# Patient Record
Sex: Female | Born: 1940 | Race: White | Hispanic: No | Marital: Married | Smoking: Never smoker
Health system: Southern US, Community
[De-identification: ages and names within clinical notes are randomized; demographics above are authoritative.]

## PROBLEM LIST (undated history)

## (undated) DIAGNOSIS — E162 Hypoglycemia, unspecified: Secondary | ICD-10-CM

## (undated) DIAGNOSIS — M8580 Other specified disorders of bone density and structure, unspecified site: Secondary | ICD-10-CM

## (undated) DIAGNOSIS — G629 Polyneuropathy, unspecified: Secondary | ICD-10-CM

## (undated) DIAGNOSIS — M797 Fibromyalgia: Secondary | ICD-10-CM

## (undated) DIAGNOSIS — Z8589 Personal history of malignant neoplasm of other organs and systems: Secondary | ICD-10-CM

## (undated) HISTORY — PX: RECTAL PROLAPSE REPAIR: SHX759

## (undated) HISTORY — DX: Other specified disorders of bone density and structure, unspecified site: M85.80

## (undated) HISTORY — DX: Polyneuropathy, unspecified: G62.9

## (undated) HISTORY — PX: DILATION AND CURETTAGE OF UTERUS: SHX78

## (undated) HISTORY — PX: CHOLECYSTECTOMY: SHX55

## (undated) HISTORY — DX: Hypoglycemia, unspecified: E16.2

## (undated) HISTORY — DX: Fibromyalgia: M79.7

## (undated) HISTORY — DX: Personal history of malignant neoplasm of other organs and systems: Z85.89

---

## 1999-06-25 ENCOUNTER — Encounter (INDEPENDENT_AMBULATORY_CARE_PROVIDER_SITE_OTHER): Payer: Self-pay | Admitting: Specialist

## 1999-06-25 ENCOUNTER — Other Ambulatory Visit: Admission: RE | Admit: 1999-06-25 | Discharge: 1999-06-25 | Payer: Self-pay | Admitting: Obstetrics and Gynecology

## 2001-09-21 ENCOUNTER — Other Ambulatory Visit: Admission: RE | Admit: 2001-09-21 | Discharge: 2001-09-21 | Payer: Self-pay | Admitting: Obstetrics and Gynecology

## 2002-03-17 ENCOUNTER — Encounter: Admission: RE | Admit: 2002-03-17 | Discharge: 2002-03-17 | Payer: Self-pay | Admitting: Internal Medicine

## 2002-03-17 ENCOUNTER — Encounter: Payer: Self-pay | Admitting: Internal Medicine

## 2002-03-30 ENCOUNTER — Encounter: Payer: Self-pay | Admitting: General Surgery

## 2002-04-04 ENCOUNTER — Observation Stay (HOSPITAL_COMMUNITY): Admission: RE | Admit: 2002-04-04 | Discharge: 2002-04-05 | Payer: Self-pay | Admitting: General Surgery

## 2002-04-04 ENCOUNTER — Encounter (INDEPENDENT_AMBULATORY_CARE_PROVIDER_SITE_OTHER): Payer: Self-pay

## 2002-04-04 ENCOUNTER — Encounter: Payer: Self-pay | Admitting: General Surgery

## 2002-04-14 ENCOUNTER — Ambulatory Visit (HOSPITAL_COMMUNITY): Admission: RE | Admit: 2002-04-14 | Discharge: 2002-04-14 | Payer: Self-pay | Admitting: General Surgery

## 2002-04-14 ENCOUNTER — Encounter: Payer: Self-pay | Admitting: General Surgery

## 2003-03-02 ENCOUNTER — Other Ambulatory Visit: Admission: RE | Admit: 2003-03-02 | Discharge: 2003-03-02 | Payer: Self-pay | Admitting: Obstetrics and Gynecology

## 2003-05-15 ENCOUNTER — Encounter: Payer: Self-pay | Admitting: Geriatric Medicine

## 2003-05-15 ENCOUNTER — Encounter: Admission: RE | Admit: 2003-05-15 | Discharge: 2003-05-15 | Payer: Self-pay | Admitting: Geriatric Medicine

## 2005-04-15 ENCOUNTER — Other Ambulatory Visit: Admission: RE | Admit: 2005-04-15 | Discharge: 2005-04-15 | Payer: Self-pay | Admitting: Obstetrics and Gynecology

## 2007-08-20 ENCOUNTER — Encounter (INDEPENDENT_AMBULATORY_CARE_PROVIDER_SITE_OTHER): Payer: Self-pay | Admitting: General Surgery

## 2007-08-20 ENCOUNTER — Ambulatory Visit (HOSPITAL_COMMUNITY): Admission: RE | Admit: 2007-08-20 | Discharge: 2007-08-20 | Payer: Self-pay | Admitting: General Surgery

## 2010-12-03 NOTE — Op Note (Signed)
[name], OAK                ACCOUNT NO.:  [id number]   MEDICAL RECORD NO.:  [id number]          PATIENT TYPE:  AMB   LOCATION:  DAY                          FACILITY:  [facility]   PHYSICIAN:  Timothy E. Earlene Plater, M.D. DATE OF BIRTH:  08/28/1940   DATE OF PROCEDURE:  DATE OF DISCHARGE:                               OPERATIVE REPORT   PREOPERATIVE DIAGNOSES:  Prolapsing hemorrhoids.   POSTOPERATIVE DIAGNOSES:  Prolapsing hemorrhoids.   PROCEDURE:  Hemorrhoidectomy by PPH.   SURGEON:  Dr. Kendrick Ranch.   ANESTHESIA:  General.   [name] is 82, otherwise perfectly healthy, normal colonoscopy,  normal bowel movements but over the years has developed prolapsing  hemorrhoids with soilage,  difficulty cleansing and bleeding.  Because  of this and failure of the usual management, she elects to proceed with  surgery which has been carefully discussed.  The patient was seen,  identified and the permit signed.   She was taken to the operating room, placed supine, general LMA  anesthesia provided.  She was placed in lithotomy, perianal area  inspected, prepped and draped in the usual fashion.  Marcaine 1/2% with  epinephrine mixed 1:30 with Wydase was injected round about the anal  orifice for a wide field block.  The anus was massaged, gently dilated  and the operating anoscope introduced. A very large bleeding hemorrhoid  was present in the  right anterior position, otherwise there was rectal  mucosal prolapse.  No actual external hemorrhoids were present.  A  pursestring suture of Prolene 2-0 was placed around about the rectal  mucosa and approximately 3 cm proximal to the dentate line. This was  checked, found to be correct and then the operating anoscope placed, the  suture lines brought through the middle of  that scope. The stapling  device opened completely, gently inserted into the operating anoscope  and popped through the pursestring suture which was then tied about the  shaft  of the stapling device. The stapling device was carefully aligned  and with gentle pressure on the suture it was closed. The position was  good. It was held for 60 seconds, fired and then held for 60 seconds and  then removed.  The specimen was a complete round of tissue with suture  intact. Further inspection of the rectal canal revealed two tiny areas  of bleeding in right posterior and left posterior. Each of these was  sutured with a 4-0 Vicryl stitch.  After careful observation, there was  no further bleeding or evidence of trouble. A Gelfoam gauze wrapped in  Surgicel was placed in the rectal canal and dry sterile dressing.  All  counts correct.  She tolerated it well and was taken to the recovery  room in good condition.   Written and verbal instructions given to her and her family.  She has  pain medication. She will alternate that with ibuprofen, call for any  trouble and return in 3 weeks.      Timothy E. Earlene Plater, M.D.  Electronically Signed     TED/MEDQ  D:  08/20/2007  T:  08/20/2007  Job:  [id number]   cc:   Hal T. Stoneking, M.D.  Fax: [phone]

## 2010-12-06 NOTE — Op Note (Signed)
[name], [name]                          ACCOUNT NO.:  [id number]   MEDICAL RECORD NO.:  [id number]                   PATIENT TYPE:  AMB   LOCATION:  DAY                                  FACILITY:  [facility]   PHYSICIAN:  Gita Kudo, M.D.              DATE OF BIRTH:  01-Sep-1940   DATE OF PROCEDURE:  04/04/2002  DATE OF DISCHARGE:                                 OPERATIVE REPORT   PREOPERATIVE DIAGNOSES:  Cholecystitis.   POSTOPERATIVE DIAGNOSES:  Cholecystitis.   OPERATION PERFORMED:  Laparoscopic cholecystectomy with intraoperative  cholangiogram.   SURGEON:  Gita Kudo, M.D.   ASSISTANT:  Donnie Coffin. Samuella Cota, M.D.   ANESTHESIA:  General endotracheal.   INDICATIONS FOR PROCEDURE:  The patient is a 70 year old female with bouts  of abdominal pain and gallbladder ultrasound showing stones.  Liver function  studies normal.   OPERATIVE FINDINGS:  The gallbladder had a few filmy adhesions.  The cystic  duct was not enlarged.  There were multiple stones in the gallbladder.  The  operative cholangiogram looked normal.  The cystic duct and artery were all  identified before transecting and clipping.   DESCRIPTION OF PROCEDURE:  Under satisfactory general endotracheal  anesthesia, having received 400 mg of Cipro IV, the patient's abdomen was  prepped and draped in standard fashion.  A transverse incision was made  above the umbilicus and the midline opened into the peritoneum.  Controlled  with a figure of eight 0 Vicryl suture.  Operating Hasson port inserted and  good CO2 pneumoperitoneum established.  Camera placed and through Marcaine  infiltrated skin incisions, two #5 ports placed laterally and a second #10  medially.  With graspers through the lateral port giving excellent exposure,  I operated through the medial port and carefully took down the adhesions to  the gallbladder.  The coagulating dissector was used.  Then a right angle  clamp was used to  circumferentially dissect the cystic duct and artery.  When certain of the anatomy, a clip was placed on the cystic duct close to  the gallbladder and multiple clips on the cystic artery.  The artery was  divided and an incision made in the cystic duct and the percutaneously  placed catheter used to get good cholangiograms.  After the x-rays were  taken, the catheter removed and the cystic duct controlled with multiple  clips and divided.  The gallbladder removed from below upwards using the  coagulating hook dissector for hemostasis and dissection.  The liver bed was  made dry by cautery.  Lavaged with saline and suctioned dry.   Camera moved to the upper port and through the lower port, a large grasper  placed and used to extract the gallbladder.  Because of the multiple stones,  the gallbladder was opened externally and forceps used to remove the stones  until it could be removed through the midline incision which  was slightly  enlarged.  Following this, the operative site was checked for hemostasis,  lavaged with saline and suctioned dry.  Ports were removed under direct  vision and the CO2 released.  The midline was then closed with a previous  figure-of-eight and a second interrupted  0 Vicryl suture.  The wounds were lavaged with saline and closed with 4-0  Vicryl for the subcutaneous tissues and Steri-Strips for the skin.  Sterile  absorbent dressings applied.  The patient was then transferred to the  recovery room from the operating room in good condition.                                                 Gita Kudo, M.D.    MRL/MEDQ  D:  04/04/2002  T:  04/04/2002  Job:  [id number]   cc:   Rachael T. Stoneking, MD  [address] [place], [place] [address]  Fax: [phone]

## 2011-04-10 LAB — BASIC METABOLIC PANEL
BUN: 10
CO2: 30
Calcium: 9.9
Chloride: 107
Creatinine, Ser: 0.75
GFR calc Af Amer: >60
GFR calc non Af Amer: >60
Glucose, Bld: 113 — ABNORMAL HIGH
Potassium: 4.3
Sodium: 144

## 2015-10-17 DIAGNOSIS — E7800 Pure hypercholesterolemia, unspecified: Secondary | ICD-10-CM | POA: Diagnosis not present

## 2015-10-17 DIAGNOSIS — Z0000 Encounter for general adult medical examination without abnormal findings: Secondary | ICD-10-CM | POA: Diagnosis not present

## 2015-10-17 DIAGNOSIS — R251 Tremor, unspecified: Secondary | ICD-10-CM | POA: Diagnosis not present

## 2015-10-17 DIAGNOSIS — Z79899 Other long term (current) drug therapy: Secondary | ICD-10-CM | POA: Diagnosis not present

## 2015-10-17 DIAGNOSIS — R002 Palpitations: Secondary | ICD-10-CM | POA: Diagnosis not present

## 2015-10-17 DIAGNOSIS — Z1389 Encounter for screening for other disorder: Secondary | ICD-10-CM | POA: Diagnosis not present

## 2015-12-04 DIAGNOSIS — H04123 Dry eye syndrome of bilateral lacrimal glands: Secondary | ICD-10-CM | POA: Diagnosis not present

## 2015-12-04 DIAGNOSIS — H10413 Chronic giant papillary conjunctivitis, bilateral: Secondary | ICD-10-CM | POA: Diagnosis not present

## 2015-12-04 DIAGNOSIS — H524 Presbyopia: Secondary | ICD-10-CM | POA: Diagnosis not present

## 2015-12-04 DIAGNOSIS — H26491 Other secondary cataract, right eye: Secondary | ICD-10-CM | POA: Diagnosis not present

## 2016-04-23 DIAGNOSIS — Z1231 Encounter for screening mammogram for malignant neoplasm of breast: Secondary | ICD-10-CM | POA: Diagnosis not present

## 2016-04-23 DIAGNOSIS — Z803 Family history of malignant neoplasm of breast: Secondary | ICD-10-CM | POA: Diagnosis not present

## 2016-05-26 ENCOUNTER — Other Ambulatory Visit: Payer: Self-pay | Admitting: Nurse Practitioner

## 2016-05-26 ENCOUNTER — Ambulatory Visit
Admission: RE | Admit: 2016-05-26 | Discharge: 2016-05-26 | Disposition: A | Discharge status: Home / Self Care | Payer: PPO | Source: Ambulatory Visit | Attending: Nurse Practitioner | Admitting: Nurse Practitioner

## 2016-05-26 DIAGNOSIS — M47816 Spondylosis without myelopathy or radiculopathy, lumbar region: Secondary | ICD-10-CM | POA: Diagnosis not present

## 2016-05-26 DIAGNOSIS — G629 Polyneuropathy, unspecified: Secondary | ICD-10-CM | POA: Diagnosis not present

## 2016-07-15 DIAGNOSIS — R202 Paresthesia of skin: Secondary | ICD-10-CM | POA: Diagnosis not present

## 2016-08-21 DIAGNOSIS — G629 Polyneuropathy, unspecified: Secondary | ICD-10-CM | POA: Diagnosis not present

## 2016-10-20 DIAGNOSIS — Z79899 Other long term (current) drug therapy: Secondary | ICD-10-CM | POA: Diagnosis not present

## 2016-10-20 DIAGNOSIS — Z0000 Encounter for general adult medical examination without abnormal findings: Secondary | ICD-10-CM | POA: Diagnosis not present

## 2016-10-20 DIAGNOSIS — G629 Polyneuropathy, unspecified: Secondary | ICD-10-CM | POA: Diagnosis not present

## 2016-10-20 DIAGNOSIS — M797 Fibromyalgia: Secondary | ICD-10-CM | POA: Diagnosis not present

## 2016-10-20 DIAGNOSIS — Z1389 Encounter for screening for other disorder: Secondary | ICD-10-CM | POA: Diagnosis not present

## 2016-10-20 DIAGNOSIS — E7800 Pure hypercholesterolemia, unspecified: Secondary | ICD-10-CM | POA: Diagnosis not present

## 2016-12-09 DIAGNOSIS — H524 Presbyopia: Secondary | ICD-10-CM | POA: Diagnosis not present

## 2017-05-14 DIAGNOSIS — H04123 Dry eye syndrome of bilateral lacrimal glands: Secondary | ICD-10-CM | POA: Diagnosis not present

## 2017-05-14 DIAGNOSIS — H1131 Conjunctival hemorrhage, right eye: Secondary | ICD-10-CM | POA: Diagnosis not present

## 2017-07-30 DIAGNOSIS — Z803 Family history of malignant neoplasm of breast: Secondary | ICD-10-CM | POA: Diagnosis not present

## 2017-07-30 DIAGNOSIS — Z1231 Encounter for screening mammogram for malignant neoplasm of breast: Secondary | ICD-10-CM | POA: Diagnosis not present

## 2017-08-19 DIAGNOSIS — I781 Nevus, non-neoplastic: Secondary | ICD-10-CM | POA: Diagnosis not present

## 2017-08-19 DIAGNOSIS — Z23 Encounter for immunization: Secondary | ICD-10-CM | POA: Diagnosis not present

## 2017-10-27 DIAGNOSIS — E7800 Pure hypercholesterolemia, unspecified: Secondary | ICD-10-CM | POA: Diagnosis not present

## 2017-10-27 DIAGNOSIS — Z1389 Encounter for screening for other disorder: Secondary | ICD-10-CM | POA: Diagnosis not present

## 2017-10-27 DIAGNOSIS — Z780 Asymptomatic menopausal state: Secondary | ICD-10-CM | POA: Diagnosis not present

## 2017-10-27 DIAGNOSIS — Z23 Encounter for immunization: Secondary | ICD-10-CM | POA: Diagnosis not present

## 2017-10-27 DIAGNOSIS — Z0000 Encounter for general adult medical examination without abnormal findings: Secondary | ICD-10-CM | POA: Diagnosis not present

## 2017-10-27 DIAGNOSIS — G629 Polyneuropathy, unspecified: Secondary | ICD-10-CM | POA: Diagnosis not present

## 2017-10-27 DIAGNOSIS — Z79899 Other long term (current) drug therapy: Secondary | ICD-10-CM | POA: Diagnosis not present

## 2017-12-10 DIAGNOSIS — Z780 Asymptomatic menopausal state: Secondary | ICD-10-CM | POA: Diagnosis not present

## 2017-12-10 DIAGNOSIS — M8589 Other specified disorders of bone density and structure, multiple sites: Secondary | ICD-10-CM | POA: Diagnosis not present

## 2017-12-15 DIAGNOSIS — H524 Presbyopia: Secondary | ICD-10-CM | POA: Diagnosis not present

## 2017-12-23 ENCOUNTER — Other Ambulatory Visit: Payer: Self-pay | Admitting: Geriatric Medicine

## 2017-12-23 DIAGNOSIS — R221 Localized swelling, mass and lump, neck: Secondary | ICD-10-CM

## 2017-12-23 DIAGNOSIS — G629 Polyneuropathy, unspecified: Secondary | ICD-10-CM | POA: Diagnosis not present

## 2017-12-23 DIAGNOSIS — E7800 Pure hypercholesterolemia, unspecified: Secondary | ICD-10-CM | POA: Diagnosis not present

## 2017-12-23 DIAGNOSIS — M810 Age-related osteoporosis without current pathological fracture: Secondary | ICD-10-CM | POA: Diagnosis not present

## 2017-12-29 ENCOUNTER — Ambulatory Visit
Admission: RE | Admit: 2017-12-29 | Discharge: 2017-12-29 | Disposition: A | Discharge status: Home / Self Care | Payer: PPO | Source: Ambulatory Visit | Attending: Geriatric Medicine | Admitting: Geriatric Medicine

## 2017-12-29 DIAGNOSIS — E041 Nontoxic single thyroid nodule: Secondary | ICD-10-CM | POA: Diagnosis not present

## 2017-12-29 DIAGNOSIS — R221 Localized swelling, mass and lump, neck: Secondary | ICD-10-CM

## 2018-11-05 DIAGNOSIS — M8588 Other specified disorders of bone density and structure, other site: Secondary | ICD-10-CM | POA: Diagnosis not present

## 2018-11-05 DIAGNOSIS — Z0000 Encounter for general adult medical examination without abnormal findings: Secondary | ICD-10-CM | POA: Diagnosis not present

## 2018-11-05 DIAGNOSIS — G629 Polyneuropathy, unspecified: Secondary | ICD-10-CM | POA: Diagnosis not present

## 2018-11-05 DIAGNOSIS — L989 Disorder of the skin and subcutaneous tissue, unspecified: Secondary | ICD-10-CM | POA: Diagnosis not present

## 2018-11-05 DIAGNOSIS — Z1389 Encounter for screening for other disorder: Secondary | ICD-10-CM | POA: Diagnosis not present

## 2018-11-05 DIAGNOSIS — E7800 Pure hypercholesterolemia, unspecified: Secondary | ICD-10-CM | POA: Diagnosis not present

## 2018-11-05 DIAGNOSIS — Z79899 Other long term (current) drug therapy: Secondary | ICD-10-CM | POA: Diagnosis not present

## 2018-11-05 DIAGNOSIS — M25562 Pain in left knee: Secondary | ICD-10-CM | POA: Diagnosis not present

## 2018-12-20 DIAGNOSIS — L570 Actinic keratosis: Secondary | ICD-10-CM | POA: Diagnosis not present

## 2018-12-20 DIAGNOSIS — L430 Hypertrophic lichen planus: Secondary | ICD-10-CM | POA: Diagnosis not present

## 2018-12-20 DIAGNOSIS — L814 Other melanin hyperpigmentation: Secondary | ICD-10-CM | POA: Diagnosis not present

## 2018-12-22 DIAGNOSIS — H524 Presbyopia: Secondary | ICD-10-CM | POA: Diagnosis not present

## 2018-12-22 DIAGNOSIS — H04123 Dry eye syndrome of bilateral lacrimal glands: Secondary | ICD-10-CM | POA: Diagnosis not present

## 2019-01-05 DIAGNOSIS — E7800 Pure hypercholesterolemia, unspecified: Secondary | ICD-10-CM | POA: Diagnosis not present

## 2019-01-05 DIAGNOSIS — M8588 Other specified disorders of bone density and structure, other site: Secondary | ICD-10-CM | POA: Diagnosis not present

## 2019-01-05 DIAGNOSIS — Z79899 Other long term (current) drug therapy: Secondary | ICD-10-CM | POA: Diagnosis not present

## 2019-01-05 DIAGNOSIS — R7309 Other abnormal glucose: Secondary | ICD-10-CM | POA: Diagnosis not present

## 2019-04-21 DIAGNOSIS — Z803 Family history of malignant neoplasm of breast: Secondary | ICD-10-CM | POA: Diagnosis not present

## 2019-04-21 DIAGNOSIS — Z1231 Encounter for screening mammogram for malignant neoplasm of breast: Secondary | ICD-10-CM | POA: Diagnosis not present

## 2019-08-28 ENCOUNTER — Ambulatory Visit: Payer: Medicare HMO

## 2019-09-02 IMAGING — US US THYROID
1 series · 14 of 25 positions shown · non-contrast
Comparison: None.

CLINICAL DATA: Neck fullness.

EXAM:
THYROID ULTRASOUND
TECHNIQUE: Ultrasound examination of the thyroid gland and adjacent soft
tissues was performed.

[Series 1: us thyroid · 0.05mm/px · 14 of 53 slices shown]
[im 1/53]
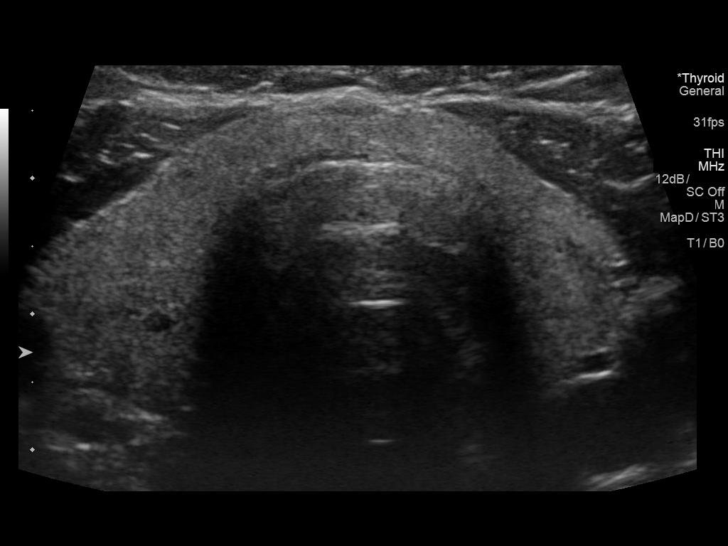
[im 5/53]
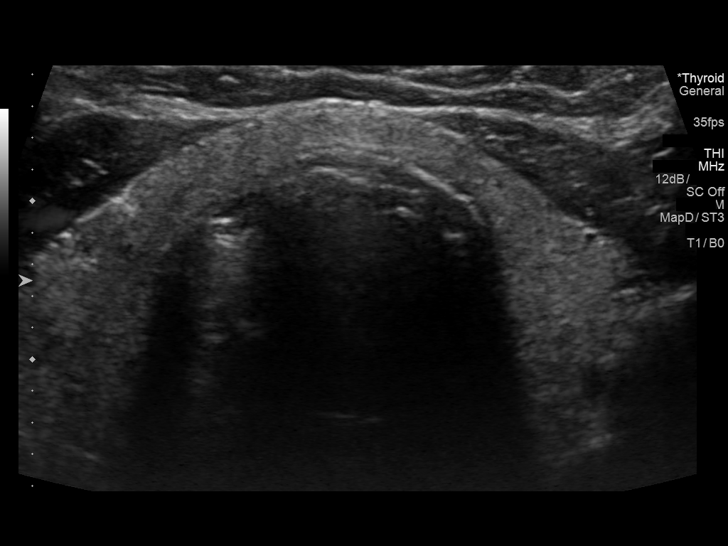
[im 9/53]
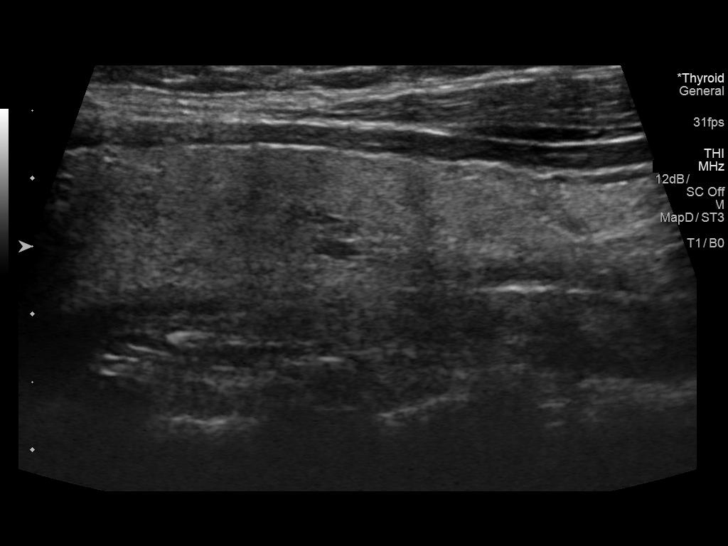
[im 14/53]
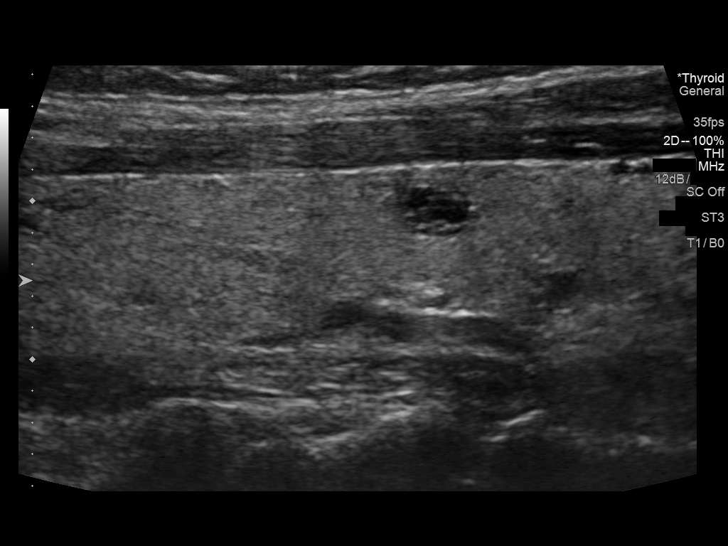
[im 18/53]
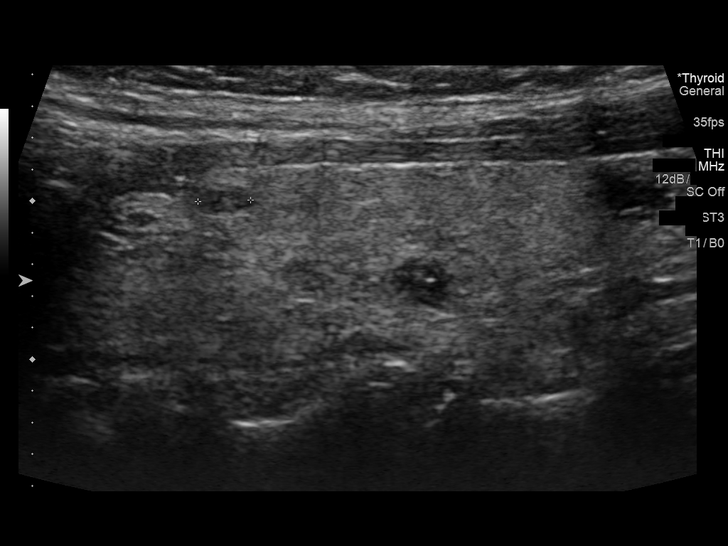
[im 20/53]
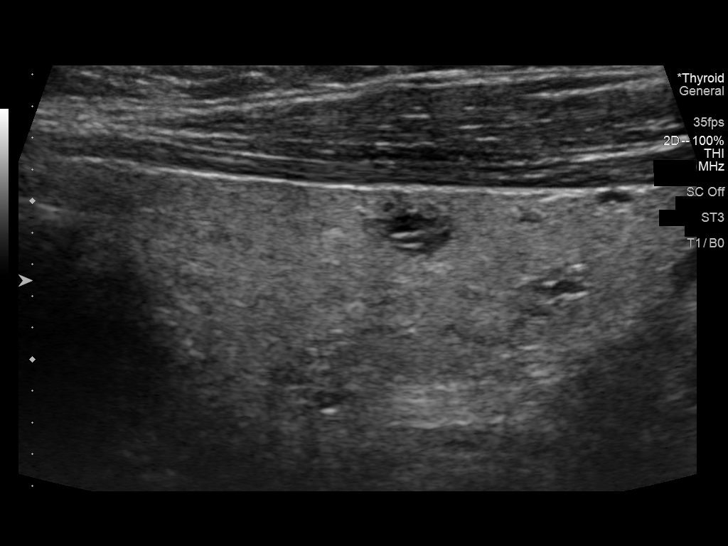
[im 24/53]
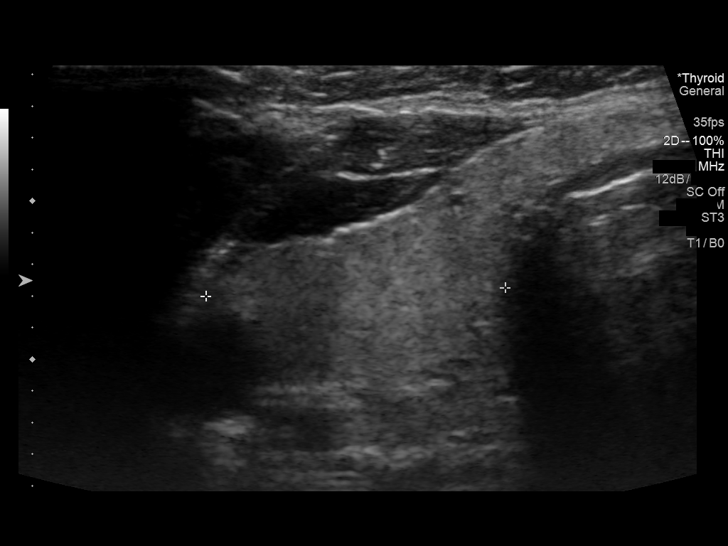
[im 29/53]
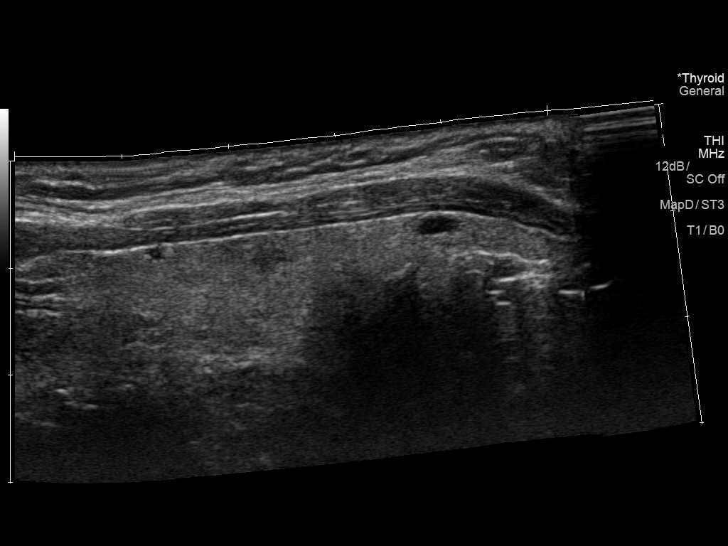
[im 33/53]
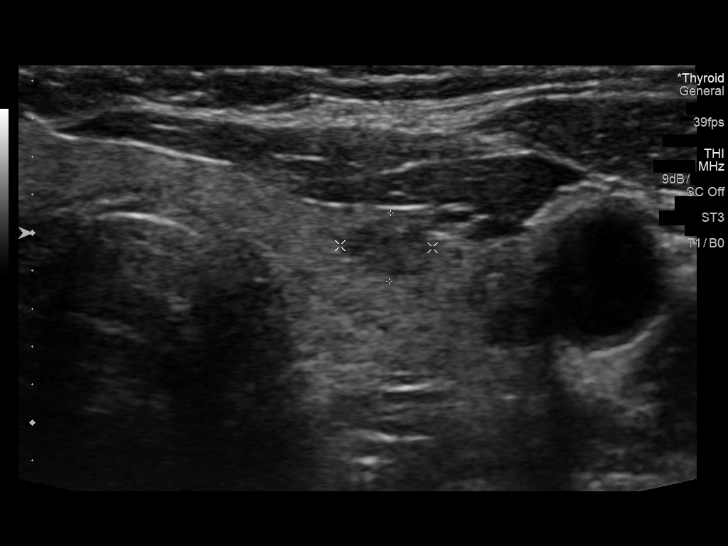
[im 35/53]
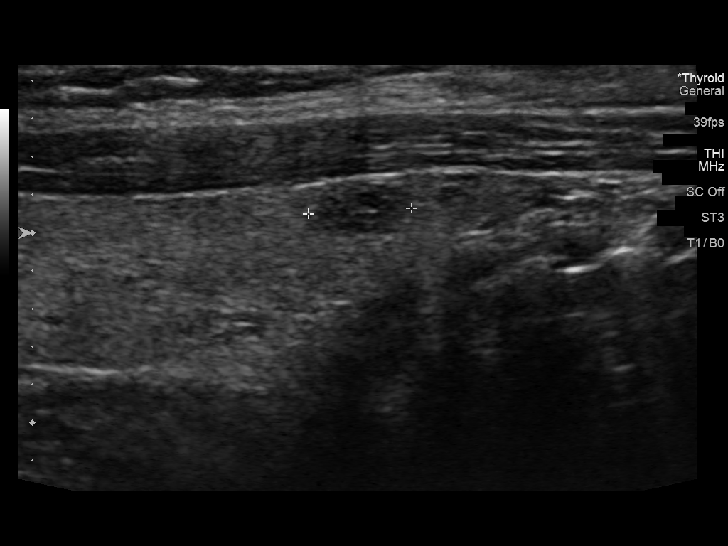
[im 40/53]
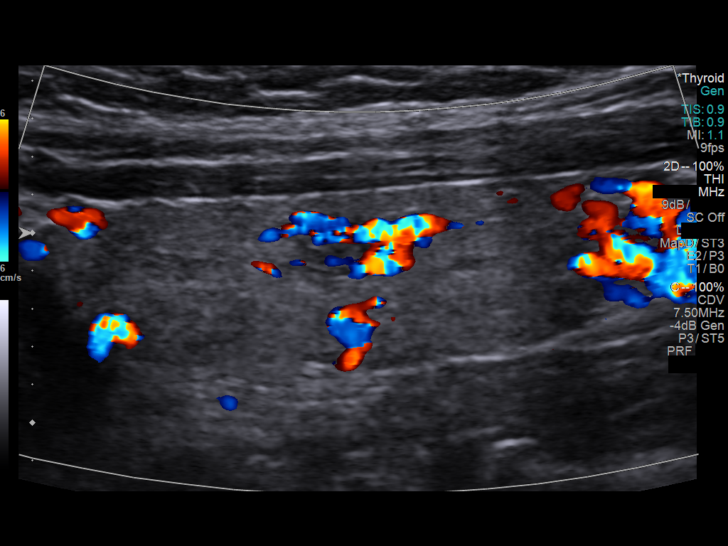
[im 44/53]
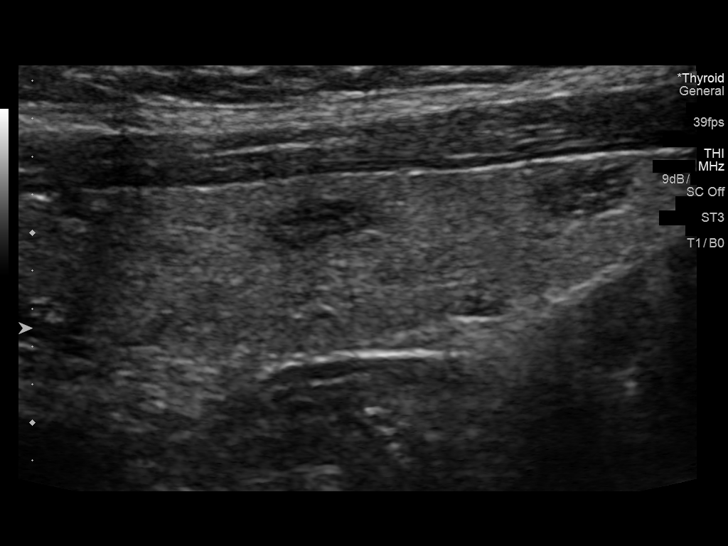
[im 48/53]
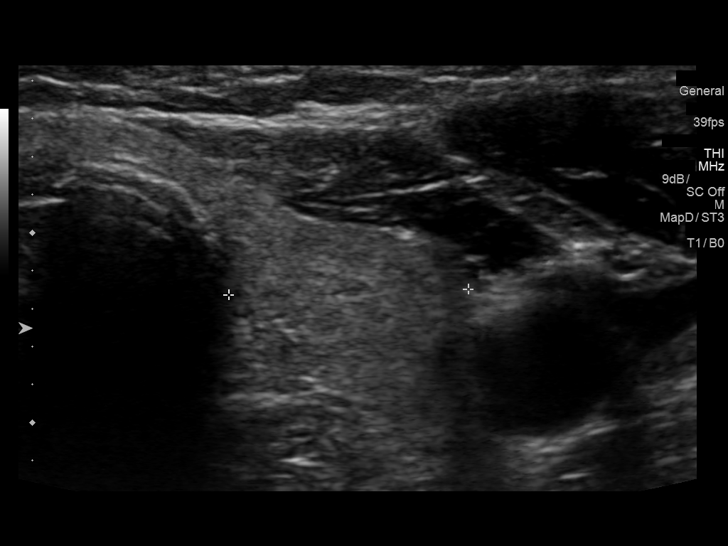
[im 53/53]
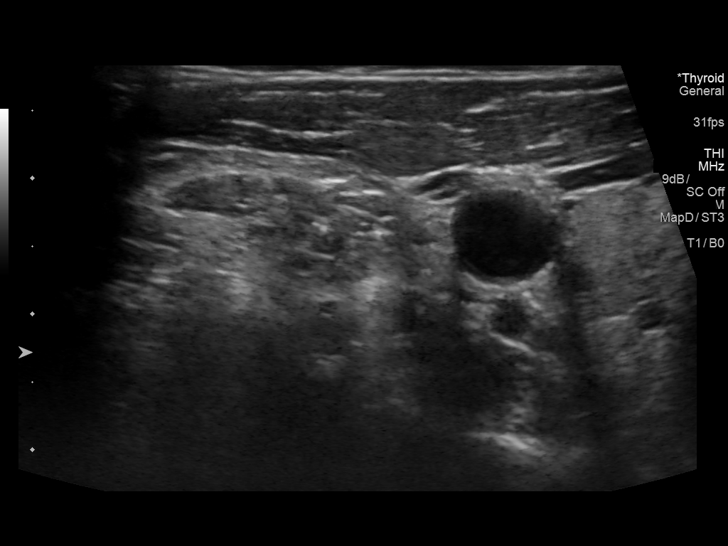

[14 of 25 positions shown; findings below may reference images not displayed]

FINDINGS: Parenchymal Echotexture: Mildly heterogenous

Isthmus:

Right lobe: 5.3 x 1.3 x 1.9 cm

Left lobe: 5.1 x 1.2 x 1.3 cm

_________________________________________________________

Estimated total number of nodules >/= 1 cm: 0

Number of spongiform nodules >/=  2 cm not described below (TR1): 0

Number of mixed cystic and solid nodules >/= 1.5 cm not described
below (TR2): 0

_________________________________________________________

Multiple small nodules throughout the thyroid. The largest nodule
measures 0.7 cm in the left thyroid lobe. These nodules do not meet
criteria for biopsy or dedicated follow-up.
IMPRESSION: Multiple small thyroid nodules. These nodules do not meet criteria
for biopsy or dedicated follow-up.

The above is in keeping with the ACR TI-RADS recommendations - [HOSPITAL] 9147;[DATE].

## 2019-09-12 ENCOUNTER — Ambulatory Visit: Payer: PPO

## 2019-11-08 DIAGNOSIS — Z1389 Encounter for screening for other disorder: Secondary | ICD-10-CM | POA: Diagnosis not present

## 2019-11-08 DIAGNOSIS — H9012 Conductive hearing loss, unilateral, left ear, with unrestricted hearing on the contralateral side: Secondary | ICD-10-CM | POA: Diagnosis not present

## 2019-11-08 DIAGNOSIS — Z79899 Other long term (current) drug therapy: Secondary | ICD-10-CM | POA: Diagnosis not present

## 2019-11-08 DIAGNOSIS — N649 Disorder of breast, unspecified: Secondary | ICD-10-CM | POA: Diagnosis not present

## 2019-11-08 DIAGNOSIS — M797 Fibromyalgia: Secondary | ICD-10-CM | POA: Diagnosis not present

## 2019-11-08 DIAGNOSIS — G629 Polyneuropathy, unspecified: Secondary | ICD-10-CM | POA: Diagnosis not present

## 2019-11-08 DIAGNOSIS — Z0000 Encounter for general adult medical examination without abnormal findings: Secondary | ICD-10-CM | POA: Diagnosis not present

## 2019-11-08 DIAGNOSIS — E7800 Pure hypercholesterolemia, unspecified: Secondary | ICD-10-CM | POA: Diagnosis not present

## 2019-11-15 DIAGNOSIS — L821 Other seborrheic keratosis: Secondary | ICD-10-CM | POA: Diagnosis not present

## 2019-11-15 DIAGNOSIS — L820 Inflamed seborrheic keratosis: Secondary | ICD-10-CM | POA: Diagnosis not present

## 2019-11-15 DIAGNOSIS — D485 Neoplasm of uncertain behavior of skin: Secondary | ICD-10-CM | POA: Diagnosis not present

## 2019-11-16 DIAGNOSIS — H6122 Impacted cerumen, left ear: Secondary | ICD-10-CM | POA: Diagnosis not present

## 2019-12-21 DIAGNOSIS — H1131 Conjunctival hemorrhage, right eye: Secondary | ICD-10-CM | POA: Diagnosis not present

## 2019-12-28 DIAGNOSIS — H1131 Conjunctival hemorrhage, right eye: Secondary | ICD-10-CM | POA: Diagnosis not present

## 2019-12-28 DIAGNOSIS — H04123 Dry eye syndrome of bilateral lacrimal glands: Secondary | ICD-10-CM | POA: Diagnosis not present

## 2019-12-28 DIAGNOSIS — H26491 Other secondary cataract, right eye: Secondary | ICD-10-CM | POA: Diagnosis not present

## 2020-07-02 DIAGNOSIS — Z20822 Contact with and (suspected) exposure to covid-19: Secondary | ICD-10-CM | POA: Diagnosis not present

## 2020-09-14 DIAGNOSIS — Z1231 Encounter for screening mammogram for malignant neoplasm of breast: Secondary | ICD-10-CM | POA: Diagnosis not present

## 2020-11-14 DIAGNOSIS — M797 Fibromyalgia: Secondary | ICD-10-CM | POA: Diagnosis not present

## 2020-11-14 DIAGNOSIS — Z0000 Encounter for general adult medical examination without abnormal findings: Secondary | ICD-10-CM | POA: Diagnosis not present

## 2020-11-14 DIAGNOSIS — Z1389 Encounter for screening for other disorder: Secondary | ICD-10-CM | POA: Diagnosis not present

## 2020-11-14 DIAGNOSIS — Z79899 Other long term (current) drug therapy: Secondary | ICD-10-CM | POA: Diagnosis not present

## 2020-11-14 DIAGNOSIS — E7800 Pure hypercholesterolemia, unspecified: Secondary | ICD-10-CM | POA: Diagnosis not present

## 2020-11-14 DIAGNOSIS — G629 Polyneuropathy, unspecified: Secondary | ICD-10-CM | POA: Diagnosis not present

## 2020-12-14 DIAGNOSIS — R300 Dysuria: Secondary | ICD-10-CM | POA: Diagnosis not present

## 2020-12-31 DIAGNOSIS — H52222 Regular astigmatism, left eye: Secondary | ICD-10-CM | POA: Diagnosis not present

## 2020-12-31 DIAGNOSIS — H52221 Regular astigmatism, right eye: Secondary | ICD-10-CM | POA: Diagnosis not present

## 2020-12-31 DIAGNOSIS — H524 Presbyopia: Secondary | ICD-10-CM | POA: Diagnosis not present

## 2020-12-31 DIAGNOSIS — H5202 Hypermetropia, left eye: Secondary | ICD-10-CM | POA: Diagnosis not present

## 2021-05-14 DIAGNOSIS — Z008 Encounter for other general examination: Secondary | ICD-10-CM | POA: Diagnosis not present

## 2021-05-14 DIAGNOSIS — R030 Elevated blood-pressure reading, without diagnosis of hypertension: Secondary | ICD-10-CM | POA: Diagnosis not present

## 2021-05-14 DIAGNOSIS — G8929 Other chronic pain: Secondary | ICD-10-CM | POA: Diagnosis not present

## 2021-05-14 DIAGNOSIS — G629 Polyneuropathy, unspecified: Secondary | ICD-10-CM | POA: Diagnosis not present

## 2021-05-14 DIAGNOSIS — Z880 Allergy status to penicillin: Secondary | ICD-10-CM | POA: Diagnosis not present

## 2021-05-14 DIAGNOSIS — Z87892 Personal history of anaphylaxis: Secondary | ICD-10-CM | POA: Diagnosis not present

## 2021-06-17 DIAGNOSIS — M1711 Unilateral primary osteoarthritis, right knee: Secondary | ICD-10-CM | POA: Diagnosis not present

## 2022-02-05 DIAGNOSIS — M797 Fibromyalgia: Secondary | ICD-10-CM | POA: Diagnosis not present

## 2022-02-05 DIAGNOSIS — E7800 Pure hypercholesterolemia, unspecified: Secondary | ICD-10-CM | POA: Diagnosis not present

## 2022-02-05 DIAGNOSIS — Z79899 Other long term (current) drug therapy: Secondary | ICD-10-CM | POA: Diagnosis not present

## 2022-02-05 DIAGNOSIS — K623 Rectal prolapse: Secondary | ICD-10-CM | POA: Diagnosis not present

## 2022-02-05 DIAGNOSIS — Z0000 Encounter for general adult medical examination without abnormal findings: Secondary | ICD-10-CM | POA: Diagnosis not present

## 2022-02-05 DIAGNOSIS — G629 Polyneuropathy, unspecified: Secondary | ICD-10-CM | POA: Diagnosis not present

## 2022-02-05 LAB — COMPREHENSIVE METABOLIC PANEL
Albumin: 4.4 (ref 3.5–5.0)
Calcium: 9.6 (ref 8.7–10.7)
eGFR: 89

## 2022-02-05 LAB — BASIC METABOLIC PANEL
BUN: 20 (ref 4–21)
CO2: 28 — AB (ref 13–22)
Chloride: 107 (ref 99–108)
Creatinine: 0.6 (ref 0.5–1.1)
Glucose: 100
Potassium: 4.3 mEq/L (ref 3.5–5.1)
Sodium: 141 (ref 137–147)

## 2022-02-05 LAB — CBC: RBC: 4.4 (ref 3.87–5.11)

## 2022-02-05 LAB — HEPATIC FUNCTION PANEL
ALT: 25 U/L (ref 7–35)
AST: 22 (ref 13–35)

## 2022-02-05 LAB — CBC AND DIFFERENTIAL
HCT: 40 (ref 36–46)
Hemoglobin: 13.3 (ref 12.0–16.0)
Platelets: 287 10*3/uL (ref 150–400)
WBC: 4.4

## 2022-03-03 DIAGNOSIS — K641 Second degree hemorrhoids: Secondary | ICD-10-CM | POA: Diagnosis not present

## 2022-05-12 ENCOUNTER — Other Ambulatory Visit (HOSPITAL_BASED_OUTPATIENT_CLINIC_OR_DEPARTMENT_OTHER): Payer: Self-pay | Admitting: Geriatric Medicine

## 2022-05-12 DIAGNOSIS — Z1231 Encounter for screening mammogram for malignant neoplasm of breast: Secondary | ICD-10-CM

## 2022-05-21 ENCOUNTER — Encounter (HOSPITAL_BASED_OUTPATIENT_CLINIC_OR_DEPARTMENT_OTHER): Payer: Self-pay

## 2022-05-21 ENCOUNTER — Ambulatory Visit (HOSPITAL_BASED_OUTPATIENT_CLINIC_OR_DEPARTMENT_OTHER)
Admission: RE | Admit: 2022-05-21 | Discharge: 2022-05-21 | Disposition: A | Discharge status: Home / Self Care | Payer: Medicare HMO | Source: Ambulatory Visit | Attending: Geriatric Medicine | Admitting: Geriatric Medicine

## 2022-05-21 DIAGNOSIS — Z1231 Encounter for screening mammogram for malignant neoplasm of breast: Secondary | ICD-10-CM | POA: Insufficient documentation

## 2022-06-27 DIAGNOSIS — H524 Presbyopia: Secondary | ICD-10-CM | POA: Diagnosis not present

## 2022-06-27 DIAGNOSIS — H52223 Regular astigmatism, bilateral: Secondary | ICD-10-CM | POA: Diagnosis not present

## 2022-09-09 DIAGNOSIS — J019 Acute sinusitis, unspecified: Secondary | ICD-10-CM | POA: Diagnosis not present

## 2022-09-09 DIAGNOSIS — R0981 Nasal congestion: Secondary | ICD-10-CM | POA: Diagnosis not present

## 2022-09-09 DIAGNOSIS — Z20822 Contact with and (suspected) exposure to covid-19: Secondary | ICD-10-CM | POA: Diagnosis not present

## 2022-09-09 DIAGNOSIS — R519 Headache, unspecified: Secondary | ICD-10-CM | POA: Diagnosis not present

## 2022-11-19 ENCOUNTER — Ambulatory Visit (INDEPENDENT_AMBULATORY_CARE_PROVIDER_SITE_OTHER): Payer: Medicare HMO | Admitting: Nurse Practitioner

## 2022-11-19 ENCOUNTER — Encounter: Payer: Self-pay | Admitting: Nurse Practitioner

## 2022-11-19 VITALS — BP 121/77 | HR 85 | Temp 97.1°F | Ht 66.5 in | Wt 146.2 lb

## 2022-11-19 DIAGNOSIS — G629 Polyneuropathy, unspecified: Secondary | ICD-10-CM | POA: Diagnosis not present

## 2022-11-19 DIAGNOSIS — M797 Fibromyalgia: Secondary | ICD-10-CM | POA: Diagnosis not present

## 2022-11-19 DIAGNOSIS — M8580 Other specified disorders of bone density and structure, unspecified site: Secondary | ICD-10-CM

## 2022-11-19 DIAGNOSIS — E162 Hypoglycemia, unspecified: Secondary | ICD-10-CM | POA: Diagnosis not present

## 2022-11-19 NOTE — Assessment & Plan Note (Signed)
Chronic, stable.  She states that she saw neurology and had testing done however they were unable to determine the cause of her neuropathy.  Will request prior records and lab results.  Continue ibuprofen as needed for pain.

## 2022-11-19 NOTE — Progress Notes (Signed)
New Patient Visit  BP 121/77 Comment: home reading  Pulse 85   Temp (!) 97.1 F (36.2 C)   Ht 5' 6.5" (1.689 m)   Wt 146 lb 3.2 oz (66.3 kg)   SpO2 96%   BMI 23.24 kg/m    Subjective:    Patient ID: [name], female    DOB: [birth year], 82 y.o.   MRN: [id number]  CC: Chief Complaint  Patient presents with   Establish Care    NP. Est. Care, no concerns    HPI: [name] is a 82 y.o. female presents for new patient visit to establish care.  Introduced to Publishing rights manager role and practice setting.  All questions answered.  Discussed provider/patient relationship and expectations.  She has a history of fibromyalgia and neuropathy in her feet.  She states that she saw neurology and had testing done but they are unsure of the cause.  She was taking gabapentin, however it caused her to become dizzy.  She has since been just walking regularly and states that her pain is controlled with this.  She will take ibuprofen as needed if the pain is severe.  She has a history of hypoglycemia.  She states that she saw endocrine and was told that she may have a tumor on her pancreas.  This was when she was younger.  She did not want surgery and has been just making sure that she eats regularly.  She has not had any trouble with her sugars dropping below 45.  She can get shaky, sweaty, and lightheaded at times.  She has a history of osteopenia.  She states that she has tried 3 different medications for building bone and they all caused hip pain so she stopped them.  She is currently walking regularly and taking a multivitamin daily.  Depression and anxiety screen done:     11/19/2022    1:56 PM  Depression screen PHQ 2/9  Decreased Interest 0  Down, Depressed, Hopeless 0  PHQ - 2 Score 0  Altered sleeping 0  Tired, decreased energy 0  Change in appetite 0  Feeling bad or failure about yourself  0  Trouble concentrating 0  Moving slowly or fidgety/restless 0  Suicidal thoughts  0  PHQ-9 Score 0  Difficult doing work/chores Not difficult at all      11/19/2022    1:57 PM  GAD 7 : Generalized Anxiety Score  Nervous, Anxious, on Edge 1  Control/stop worrying 2  Worry too much - different things 2  Trouble relaxing 0  Restless 1  Easily annoyed or irritable 0  Afraid - awful might happen 1  Total GAD 7 Score 7    Past Medical History:  Diagnosis Date   Fibromyalgia    Hypoglycemia    Neuropathy    Osteopenia     Past Surgical History:  Procedure Laterality Date   CHOLECYSTECTOMY     DILATION AND CURETTAGE OF UTERUS     RECTAL PROLAPSE REPAIR      Family History  Problem Relation Age of Onset   Hip fracture Mother    Alzheimer's disease Mother      Social History   Tobacco Use   Smoking status: Never    Passive exposure: Never   Smokeless tobacco: Never  Vaping Use   Vaping Use: Never used  Substance Use Topics   Alcohol use: Yes    Alcohol/week: 3.0 standard drinks of alcohol    Types: 3 Glasses of  wine per week   Drug use: Never    Current Outpatient Medications on File Prior to Visit  Medication Sig Dispense Refill   ibuprofen (ADVIL) 200 MG tablet Take 200 mg by mouth as needed.     Multiple Vitamin (MULTIVITAMIN ADULT PO) Take by mouth daily.     No current facility-administered medications on file prior to visit.     Review of Systems  Constitutional:  Positive for fatigue. Negative for fever.  HENT: Negative.    Eyes: Negative.   Respiratory: Negative.    Cardiovascular: Negative.   Gastrointestinal: Negative.   Genitourinary: Negative.   Musculoskeletal:  Positive for myalgias.  Skin: Negative.   Neurological: Negative.   Psychiatric/Behavioral: Negative.        Objective:    BP 121/77 Comment: home reading  Pulse 85   Temp (!) 97.1 F (36.2 C)   Ht 5' 6.5" (1.689 m)   Wt 146 lb 3.2 oz (66.3 kg)   SpO2 96%   BMI 23.24 kg/m   Wt Readings from Last 3 Encounters:  11/19/22 146 lb 3.2 oz (66.3 kg)    BP  Readings from Last 3 Encounters:  11/19/22 121/77    Physical Exam Vitals and nursing note reviewed.  Constitutional:      General: She is not in acute distress.    Appearance: Normal appearance.  HENT:     Head: Normocephalic and atraumatic.     Right Ear: Tympanic membrane, ear canal and external ear normal.     Left Ear: Tympanic membrane, ear canal and external ear normal.  Eyes:     Conjunctiva/sclera: Conjunctivae normal.  Cardiovascular:     Rate and Rhythm: Normal rate and regular rhythm.     Pulses: Normal pulses.     Heart sounds: Normal heart sounds.  Pulmonary:     Effort: Pulmonary effort is normal.     Breath sounds: Normal breath sounds.  Abdominal:     Palpations: Abdomen is soft.     Tenderness: There is no abdominal tenderness.  Musculoskeletal:        General: Normal range of motion.     Cervical back: Normal range of motion and neck supple.     Right lower leg: No edema.     Left lower leg: No edema.  Lymphadenopathy:     Cervical: No cervical adenopathy.  Skin:    General: Skin is warm and dry.  Neurological:     General: No focal deficit present.     Mental Status: She is alert and oriented to person, place, and time.     Cranial Nerves: No cranial nerve deficit.     Coordination: Coordination normal.     Gait: Gait normal.  Psychiatric:        Mood and Affect: Mood normal.        Behavior: Behavior normal.        Thought Content: Thought content normal.        Judgment: Judgment normal.        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  She states that she saw endocrinology and was told that she may have a tumor on her pancreas.  She never got this looked into further and did not want surgery.  She make sure that she eats regularly.  She  has not had a blood sugar below 45.  Continue eating small frequent meals.   Follow-up with any concerns.        Nervous and Auditory   Neuropathy    Chronic, stable.  She states that she saw neurology and had testing done however they were unable to determine the cause of her neuropathy.  Will request prior records and lab results.  Continue ibuprofen as needed for pain.        Musculoskeletal and Integument   Osteopenia - Primary    She states that she has a history of osteopenia and was given 3 different medications to treat, however it caused severe hip pain.  She was unable to tolerate this and stopped them.  She is currently walking regularly.  Will request prior DEXA scan results from prior PCP        Other   Fibromyalgia    Chronic, stable.  Continue ibuprofen as needed for pain and walking regularly.      Relevant Medications   ibuprofen (ADVIL) 200 MG tablet     Follow up plan: Return in about 3 months (around 02/19/2023) for CPE.

## 2022-11-19 NOTE — Patient Instructions (Signed)
It was great to see you!  I am requesting records from Dr. Laverle Hobby office.   Let's follow-up in 3 months, sooner if you have concerns.  If a referral was placed today, you will be contacted for an appointment. Please note that routine referrals can sometimes take up to 3-4 weeks to process. Please call our office if you haven't heard anything after this time frame.  Take care,  Rodman Pickle, NP

## 2022-11-19 NOTE — Assessment & Plan Note (Signed)
She states that she has a history of osteopenia and was given 3 different medications to treat, however it caused severe hip pain.  She was unable to tolerate this and stopped them.  She is currently walking regularly.  Will request prior DEXA scan results from prior PCP

## 2022-11-19 NOTE — Assessment & Plan Note (Signed)
Chronic, stable.  Continue ibuprofen as needed for pain and walking regularly.

## 2022-11-19 NOTE — Assessment & Plan Note (Signed)
Chronic, stable.  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  She states that she saw endocrinology and was told that she may have a tumor on her pancreas.  She never got this looked into further and did not want surgery.  She make sure that she eats regularly.  She has not had a blood sugar below 45.  Continue eating small frequent meals.  Follow-up with any concerns.

## 2022-12-03 ENCOUNTER — Encounter: Payer: Self-pay | Admitting: Nurse Practitioner

## 2022-12-22 ENCOUNTER — Ambulatory Visit (INDEPENDENT_AMBULATORY_CARE_PROVIDER_SITE_OTHER): Payer: Medicare HMO

## 2022-12-22 VITALS — Ht 66.5 in | Wt 140.0 lb

## 2022-12-22 DIAGNOSIS — Z0000 Encounter for general adult medical examination without abnormal findings: Secondary | ICD-10-CM

## 2022-12-22 NOTE — Patient Instructions (Signed)
Ms. [name] , Thank you for taking time to come for your Medicare Wellness Visit. I appreciate your ongoing commitment to your health goals. Please review the following plan we discussed and let me know if I can assist you in the future.   These are the goals we discussed:  Goals      Patient Stated     12/22/2022, no goals        This is a list of the screening recommended for you and due dates:  Health Maintenance  Topic Date Due   Zoster (Shingles) Vaccine (1 of 2) Never done   DEXA scan (bone density measurement)  Never done   DTaP/Tdap/Td vaccine (2 - Td or Tdap) 10/07/2021   COVID-19 Vaccine (4 - 2023-24 season) 03/21/2022   Flu Shot  02/19/2023   Medicare Annual Wellness Visit  12/22/2023   Pneumonia Vaccine  Completed   HPV Vaccine  Aged Out    Advanced directives: Please bring a copy of your POA (Power of [place]) and/or Living Will to your next appointment.   Conditions/risks identified: none  Next appointment: Follow up in one year for your annual wellness visit    Preventive Care 65 Years and Older, Female Preventive care refers to lifestyle choices and visits with your health care provider that can promote health and wellness. What does preventive care include? A yearly physical exam. This is also called an annual well check. Dental exams once or twice a year. Routine eye exams. Ask your health care provider how often you should have your eyes checked. Personal lifestyle choices, including: Daily care of your teeth and gums. Regular physical activity. Eating a healthy diet. Avoiding tobacco and drug use. Limiting alcohol use. Practicing safe sex. Taking low-dose aspirin every day. Taking vitamin and mineral supplements as recommended by your health care provider. What happens during an annual well check? The services and screenings done by your health care provider during your annual well check will depend on your age, overall health, lifestyle risk factors,  and family history of disease. Counseling  Your health care provider may ask you questions about your: Alcohol use. Tobacco use. Drug use. Emotional well-being. Home and relationship well-being. Sexual activity. Eating habits. History of falls. Memory and ability to understand (cognition). Work and work Astronomer. Reproductive health. Screening  You may have the following tests or measurements: Height, weight, and BMI. Blood pressure. Lipid and cholesterol levels. These may be checked every 5 years, or more frequently if you are over 65 years old. Skin check. Lung cancer screening. You may have this screening every year starting at age 81 if you have a 30-pack-year history of smoking and currently smoke or have quit within the past 15 years. Fecal occult blood test (FOBT) of the stool. You may have this test every year starting at age 65. Flexible sigmoidoscopy or colonoscopy. You may have a sigmoidoscopy every 5 years or a colonoscopy every 10 years starting at age 76. Hepatitis C blood test. Hepatitis B blood test. Sexually transmitted disease (STD) testing. Diabetes screening. This is done by checking your blood sugar (glucose) after you have not eaten for a while (fasting). You may have this done every 1-3 years. Bone density scan. This is done to screen for osteoporosis. You may have this done starting at age 54. Mammogram. This may be done every 1-2 years. Talk to your health care provider about how often you should have regular mammograms. Talk with your health care provider about your test  results, treatment options, and if necessary, the need for more tests. Vaccines  Your health care provider may recommend certain vaccines, such as: Influenza vaccine. This is recommended every year. Tetanus, diphtheria, and acellular pertussis (Tdap, Td) vaccine. You may need a Td booster every 10 years. Zoster vaccine. You may need this after age 1. Pneumococcal 13-valent conjugate  (PCV13) vaccine. One dose is recommended after age 67. Pneumococcal polysaccharide (PPSV23) vaccine. One dose is recommended after age 41. Talk to your health care provider about which screenings and vaccines you need and how often you need them. This information is not intended to replace advice given to you by your health care provider. Make sure you discuss any questions you have with your health care provider. Document Released: 08/03/2015 Document Revised: 03/26/2016 Document Reviewed: 05/08/2015 Elsevier Interactive Patient Education  2017 [employer].  Fall Prevention in the Home Falls can cause injuries. They can happen to people of all ages. There are many things you can do to make your home safe and to help prevent falls. What can I do on the outside of my home? Regularly fix the edges of walkways and driveways and fix any cracks. Remove anything that might make you trip as you walk through a door, such as a raised step or threshold. Trim any bushes or trees on the path to your home. Use bright outdoor lighting. Clear any walking paths of anything that might make someone trip, such as rocks or tools. Regularly check to see if handrails are loose or broken. Make sure that both sides of any steps have handrails. Any raised decks and porches should have guardrails on the edges. Have any leaves, snow, or ice cleared regularly. Use sand or salt on walking paths during winter. Clean up any spills in your garage right away. This includes oil or grease spills. What can I do in the bathroom? Use night lights. Install grab bars by the toilet and in the tub and shower. Do not use towel bars as grab bars. Use non-skid mats or decals in the tub or shower. If you need to sit down in the shower, use a plastic, non-slip stool. Keep the floor dry. Clean up any water that spills on the floor as soon as it happens. Remove soap buildup in the tub or shower regularly. Attach bath mats securely with  double-sided non-slip rug tape. Do not have throw rugs and other things on the floor that can make you trip. What can I do in the bedroom? Use night lights. Make sure that you have a light by your bed that is easy to reach. Do not use any sheets or blankets that are too big for your bed. They should not hang down onto the floor. Have a firm chair that has side arms. You can use this for support while you get dressed. Do not have throw rugs and other things on the floor that can make you trip. What can I do in the kitchen? Clean up any spills right away. Avoid walking on wet floors. Keep items that you use a lot in easy-to-reach places. If you need to reach something above you, use a strong step stool that has a grab bar. Keep electrical cords out of the way. Do not use floor polish or wax that makes floors slippery. If you must use wax, use non-skid floor wax. Do not have throw rugs and other things on the floor that can make you trip. What can I do with my stairs?  Do not leave any items on the stairs. Make sure that there are handrails on both sides of the stairs and use them. Fix handrails that are broken or loose. Make sure that handrails are as long as the stairways. Check any carpeting to make sure that it is firmly attached to the stairs. Fix any carpet that is loose or worn. Avoid having throw rugs at the top or bottom of the stairs. If you do have throw rugs, attach them to the floor with carpet tape. Make sure that you have a light switch at the top of the stairs and the bottom of the stairs. If you do not have them, ask someone to add them for you. What else can I do to help prevent falls? Wear shoes that: Do not have high heels. Have rubber bottoms. Are comfortable and fit you well. Are closed at the toe. Do not wear sandals. If you use a stepladder: Make sure that it is fully opened. Do not climb a closed stepladder. Make sure that both sides of the stepladder are locked  into place. Ask someone to hold it for you, if possible. Clearly mark and make sure that you can see: Any grab bars or handrails. First and last steps. Where the edge of each step is. Use tools that help you move around (mobility aids) if they are needed. These include: Canes. Walkers. Scooters. Crutches. Turn on the lights when you go into a dark area. Replace any light bulbs as soon as they burn out. Set up your furniture so you have a clear path. Avoid moving your furniture around. If any of your floors are uneven, fix them. If there are any pets around you, be aware of where they are. Review your medicines with your doctor. Some medicines can make you feel dizzy. This can increase your chance of falling. Ask your doctor what other things that you can do to help prevent falls. This information is not intended to replace advice given to you by your health care provider. Make sure you discuss any questions you have with your health care provider. Document Released: 05/03/2009 Document Revised: 12/13/2015 Document Reviewed: 08/11/2014 Elsevier Interactive Patient Education  2017 [employer].

## 2022-12-22 NOTE — Progress Notes (Signed)
I connected with  [name] on 12/22/22 by a audio enabled telemedicine application and verified that I am speaking with the correct person using two identifiers.  Patient Location: Home  Provider Location: Office/Clinic  I discussed the limitations of evaluation and management by telemedicine. The patient expressed understanding and agreed to proceed. Subjective:   [name] is a 82 y.o. female who presents for Medicare Annual (Subsequent) preventive examination.  Review of Systems     Cardiac Risk Factors include: advanced age (>39men, >35 women)     Objective:    Today's Vitals   12/22/22 1457  Weight: 140 lb (63.5 kg)  Height: 5' 6.5" (1.689 m)  PainSc: 6    Body mass index is 22.26 kg/m.     12/22/2022    3:02 PM  Advanced Directives  Does Patient Have a Medical Advance Directive? Yes  Type of Estate agent of [place];Living will  Copy of Healthcare Power of Attorney in Chart? No - copy requested    Current Medications (verified) Outpatient Encounter Medications as of 12/22/2022  Medication Sig   ibuprofen (ADVIL) 200 MG tablet Take 200 mg by mouth as needed.   melatonin 5 MG TABS Take 5 mg by mouth at bedtime.   Multiple Vitamin (MULTIVITAMIN ADULT PO) Take by mouth daily.   No facility-administered encounter medications on file as of 12/22/2022.    Allergies (verified) Penicillins and Lidocaine   History: Past Medical History:  Diagnosis Date   Fibromyalgia    Hypoglycemia    Neuropathy    Osteopenia    Past Surgical History:  Procedure Laterality Date   CHOLECYSTECTOMY     DILATION AND CURETTAGE OF UTERUS     RECTAL PROLAPSE REPAIR     Family History  Problem Relation Age of Onset   Hip fracture Mother    Alzheimer's disease Mother    Social History   Socioeconomic History   Marital status: Married    Spouse name: Not on file   Number of children: 3   Years of education: Not on file   Highest education  level: Not on file  Occupational History   Not on file  Tobacco Use   Smoking status: Never    Passive exposure: Never   Smokeless tobacco: Never  Vaping Use   Vaping Use: Never used  Substance and Sexual Activity   Alcohol use: Yes    Alcohol/week: 3.0 standard drinks of alcohol    Types: 3 Glasses of wine per week   Drug use: Never   Sexual activity: Not Currently  Other Topics Concern   Not on file  Social History Narrative   Not on file   Social Determinants of Health   Financial Resource Strain: Low Risk  (12/22/2022)   Overall Financial Resource Strain (CARDIA)    Difficulty of Paying Living Expenses: Not hard at all  Food Insecurity: No Food Insecurity (12/22/2022)   Hunger Vital Sign    Worried About Running Out of Food in the Last Year: Never true    Ran Out of Food in the Last Year: Never true  Transportation Needs: No Transportation Needs (12/22/2022)   PRAPARE - Administrator, Civil Service (Medical): No    Lack of Transportation (Non-Medical): No  Physical Activity: Sufficiently Active (12/22/2022)   Exercise Vital Sign    Days of Exercise per Week: 7 days    Minutes of Exercise per Session: 40 min  Stress: No Stress Concern Present (  12/22/2022)   [employer] [employer] - Occupational Stress Questionnaire    Feeling of Stress : Only a little  Social Connections: Not on file    Tobacco Counseling Counseling given: Not Answered   Clinical Intake:  Pre-visit preparation completed: Yes  Pain : 0-10 Pain Score: 6  Pain Type: Chronic pain Pain Location: Knee Pain Orientation: Right Pain Descriptors / Indicators: Aching Pain Onset: More than a month ago Pain Frequency: Constant     Nutritional Status: BMI of 19-24  Normal Nutritional Risks: None Diabetes: No  How often do you need to have someone help you when you read instructions, pamphlets, or other written materials from your doctor or pharmacy?: 1 - Never  Diabetic?  no  Interpreter Needed?: No  Information entered by :: NAllen LPN   Activities of Daily Living    12/22/2022    3:03 PM  In your present state of health, do you have any difficulty performing the following activities:  Hearing? 0  Vision? 0  Difficulty concentrating or making decisions? 0  Walking or climbing stairs? 0  Dressing or bathing? 0  Doing errands, shopping? 0  Preparing Food and eating ? N  Using the Toilet? N  In the past six months, have you accidently leaked urine? N  Do you have problems with loss of bowel control? N  Managing your Medications? N  Managing your Finances? N  Housekeeping or managing your Housekeeping? N    Patient Care Team: Gerre Scull, NP as PCP - General (Internal Medicine)  Indicate any recent Medical Services you may have received from other than [facility] providers in the past year (date may be approximate).     Assessment:   This is a routine wellness examination for [name].  Hearing/Vision screen Vision Screening - Comments:: Regular eye exams, [facility]  Dietary issues and exercise activities discussed: Current Exercise Habits: Home exercise routine, Type of exercise: walking, Time (Minutes): 40, Frequency (Times/Week): 7, Weekly Exercise (Minutes/Week): 280   Goals Addressed             This Visit's Progress    Patient Stated       12/22/2022, no goals       Depression Screen    12/22/2022    3:03 PM 11/19/2022    1:56 PM  PHQ 2/9 Scores  PHQ - 2 Score 0 0  PHQ- 9 Score  0    Fall Risk    12/22/2022    3:03 PM 11/19/2022    1:55 PM  Fall Risk   Falls in the past year? 0 0  Number falls in past yr: 0 0  Injury with Fall? 0 0  Risk for fall due to : No Fall Risks No Fall Risks  Follow up Falls prevention discussed;Education provided;Falls evaluation completed Falls evaluation completed    FALL RISK PREVENTION PERTAINING TO THE HOME:  Any stairs in or around the home? Yes  If so, are there any without  handrails? No  Home free of loose throw rugs in walkways, pet beds, electrical cords, etc? Yes  Adequate lighting in your home to reduce risk of falls? Yes   ASSISTIVE DEVICES UTILIZED TO PREVENT FALLS:  Life alert? No  Use of a cane, walker or w/c? No  Grab bars in the bathroom? Yes  Shower chair or bench in shower? Yes  Elevated toilet seat or a handicapped toilet? Yes   TIMED UP AND GO:  Was the test performed?  No .      Cognitive Function:        12/22/2022    3:04 PM  6CIT Screen  What Year? 0 points  What month? 0 points  What time? 0 points  Count back from 20 0 points  Months in reverse 0 points  Repeat phrase 0 points  Total Score 0 points    Immunizations Immunization History  Administered Date(s) Administered   Influenza-Unspecified 05/03/2021   Moderna SARS-COV2 Booster Vaccination 05/27/2021   Moderna Sars-Covid-2 Vaccination 08/20/2019, 09/19/2019, 06/05/2020   Pneumococcal Conjugate-13 10/14/2013   Pneumococcal Polysaccharide-23 05/13/2006, 10/27/2017   Tdap 10/08/2011   Zoster, Live 09/26/2010    TDAP status: Up to date  Flu Vaccine status: Up to date  Pneumococcal vaccine status: Up to date  Covid-19 vaccine status: Completed vaccines  Qualifies for Shingles Vaccine? Yes   Zostavax completed Yes   Shingrix Completed?: No.    Education has been provided regarding the importance of this vaccine. Patient has been advised to call insurance company to determine out of pocket expense if they have not yet received this vaccine. Advised may also receive vaccine at local pharmacy or Health Dept. Verbalized acceptance and understanding.  Screening Tests Health Maintenance  Topic Date Due   Zoster Vaccines- Shingrix (1 of 2) Never done   DEXA SCAN  Never done   DTaP/Tdap/Td (2 - Td or Tdap) 10/07/2021   COVID-19 Vaccine (4 - 2023-24 season) 03/21/2022   Medicare Annual Wellness (AWV)  02/06/2023   INFLUENZA VACCINE  02/19/2023   Pneumonia  Vaccine 30+ Years old  Completed   HPV VACCINES  Aged Out    Health Maintenance  Health Maintenance Due  Topic Date Due   Zoster Vaccines- Shingrix (1 of 2) Never done   DEXA SCAN  Never done   DTaP/Tdap/Td (2 - Td or Tdap) 10/07/2021   COVID-19 Vaccine (4 - 2023-24 season) 03/21/2022   Medicare Annual Wellness (AWV)  02/06/2023    Colorectal cancer screening: No longer required.   Mammogram status: Completed 05/21/2022. Repeat every year  Bone Density status: Completed 2019.   Lung Cancer Screening: (Low Dose CT Chest recommended if Age 63-80 years, 30 pack-year currently smoking OR have quit w/in 15years.) does not qualify.   Lung Cancer Screening Referral: no  Additional Screening:  Hepatitis C Screening: does not qualify;   Vision Screening: Recommended annual ophthalmology exams for early detection of glaucoma and other disorders of the eye. Is the patient up to date with their annual eye exam?  Yes  Who is the provider or what is the name of the office in which the patient attends annual eye exams? [facility] If pt is not established with a provider, would they like to be referred to a provider to establish care? No .   Dental Screening: Recommended annual dental exams for proper oral hygiene  Community Resource Referral / Chronic Care Management: CRR required this visit?  No   CCM required this visit?  No      Plan:     I have personally reviewed and noted the following in the patient's chart:   Medical and social history Use of alcohol, tobacco or illicit drugs  Current medications and supplements including opioid prescriptions. Patient is not currently taking opioid prescriptions. Functional ability and status Nutritional status Physical activity Advanced directives List of other physicians Hospitalizations, surgeries, and ER visits in previous 12 months Vitals Screenings to include cognitive, depression, and falls Referrals and  appointments  In addition, I have reviewed and discussed with patient certain preventive protocols, quality metrics, and best practice recommendations. A written personalized care plan for preventive services as well as general preventive health recommendations were provided to patient.     Barb Merino, LPN   07/26/1094   Nurse Notes: none  Due to this being a virtual visit, the after visit summary with patients personalized plan was offered to patient via mail or my-chart.  Patient would like to access on my-chart

## 2023-01-12 DIAGNOSIS — M1711 Unilateral primary osteoarthritis, right knee: Secondary | ICD-10-CM | POA: Diagnosis not present

## 2023-01-12 DIAGNOSIS — G8929 Other chronic pain: Secondary | ICD-10-CM | POA: Diagnosis not present

## 2023-01-12 DIAGNOSIS — M25561 Pain in right knee: Secondary | ICD-10-CM | POA: Diagnosis not present

## 2023-02-18 ENCOUNTER — Encounter (INDEPENDENT_AMBULATORY_CARE_PROVIDER_SITE_OTHER): Payer: Self-pay

## 2023-03-03 ENCOUNTER — Ambulatory Visit (INDEPENDENT_AMBULATORY_CARE_PROVIDER_SITE_OTHER): Payer: Medicare HMO | Admitting: Nurse Practitioner

## 2023-03-03 ENCOUNTER — Encounter: Payer: Self-pay | Admitting: Nurse Practitioner

## 2023-03-03 VITALS — BP 108/70 | HR 81 | Temp 98.1°F | Ht 66.5 in | Wt 145.6 lb

## 2023-03-03 DIAGNOSIS — E162 Hypoglycemia, unspecified: Secondary | ICD-10-CM

## 2023-03-03 DIAGNOSIS — M8580 Other specified disorders of bone density and structure, unspecified site: Secondary | ICD-10-CM | POA: Diagnosis not present

## 2023-03-03 DIAGNOSIS — E7800 Pure hypercholesterolemia, unspecified: Secondary | ICD-10-CM | POA: Insufficient documentation

## 2023-03-03 DIAGNOSIS — G629 Polyneuropathy, unspecified: Secondary | ICD-10-CM | POA: Diagnosis not present

## 2023-03-03 DIAGNOSIS — D229 Melanocytic nevi, unspecified: Secondary | ICD-10-CM | POA: Insufficient documentation

## 2023-03-03 DIAGNOSIS — K623 Rectal prolapse: Secondary | ICD-10-CM | POA: Insufficient documentation

## 2023-03-03 DIAGNOSIS — M797 Fibromyalgia: Secondary | ICD-10-CM | POA: Diagnosis not present

## 2023-03-03 DIAGNOSIS — Z0000 Encounter for general adult medical examination without abnormal findings: Secondary | ICD-10-CM

## 2023-03-03 LAB — LIPID PANEL
Cholesterol: 252 mg/dL — ABNORMAL HIGH (ref 0–200)
HDL: 81.5 mg/dL (ref >39.00)
LDL Cholesterol: 151 mg/dL — ABNORMAL HIGH (ref 0–99)
NonHDL: 170.12
Total CHOL/HDL Ratio: 3
Triglycerides: 97 mg/dL (ref 0.0–149.0)
VLDL: 19.4 mg/dL (ref 0.0–40.0)

## 2023-03-03 LAB — CBC WITH DIFFERENTIAL/PLATELET
Basophils Absolute: 0 10*3/uL (ref 0.0–0.1)
Basophils Relative: 0.5 % (ref 0.0–3.0)
Eosinophils Absolute: 0 10*3/uL (ref 0.0–0.7)
Eosinophils Relative: 0.4 % (ref 0.0–5.0)
HCT: 42.6 % (ref 36.0–46.0)
Hemoglobin: 13.9 g/dL (ref 12.0–15.0)
Lymphocytes Relative: 21.8 % (ref 12.0–46.0)
Lymphs Abs: 1.2 10*3/uL (ref 0.7–4.0)
MCHC: 32.7 g/dL (ref 30.0–36.0)
MCV: 91.1 fl (ref 78.0–100.0)
Monocytes Absolute: 0.4 10*3/uL (ref 0.1–1.0)
Monocytes Relative: 6.7 % (ref 3.0–12.0)
Neutro Abs: 4 10*3/uL (ref 1.4–7.7)
Neutrophils Relative %: 70.6 % (ref 43.0–77.0)
Platelets: 314 10*3/uL (ref 150.0–400.0)
RBC: 4.67 Mil/uL (ref 3.87–5.11)
RDW: 13.2 % (ref 11.5–15.5)
WBC: 5.7 10*3/uL (ref 4.0–10.5)

## 2023-03-03 LAB — COMPREHENSIVE METABOLIC PANEL
ALT: 26 U/L (ref 0–35)
AST: 24 U/L (ref 0–37)
Albumin: 4.5 g/dL (ref 3.5–5.2)
Alkaline Phosphatase: 70 U/L (ref 39–117)
BUN: 15 mg/dL (ref 6–23)
CO2: 29 mEq/L (ref 19–32)
Calcium: 9.9 mg/dL (ref 8.4–10.5)
Chloride: 100 mEq/L (ref 96–112)
Creatinine, Ser: 0.63 mg/dL (ref 0.40–1.20)
GFR: 82.66 mL/min (ref >60.00)
Glucose, Bld: 100 mg/dL — ABNORMAL HIGH (ref 70–99)
Potassium: 4.1 mEq/L (ref 3.5–5.1)
Sodium: 136 mEq/L (ref 135–145)
Total Bilirubin: 0.4 mg/dL (ref 0.2–1.2)
Total Protein: 6.7 g/dL (ref 6.0–8.3)

## 2023-03-03 NOTE — Assessment & Plan Note (Signed)
Chronic (stable). Elevated lipid levels from 2020. Checking lipid panel today.

## 2023-03-03 NOTE — Assessment & Plan Note (Addendum)
Chronic (Ongoing). 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  She was informed by the general surgeon she has scar tissue from previous surgery. She is having some issues with rectum again and is following a treatment plan for management which is helping.

## 2023-03-03 NOTE — Assessment & Plan Note (Addendum)
Chronic (Ongoing). Patient continues to have hypoglycemic episodes. Patient confirms she eats her regular 3 meals/day. However patient states the episodes come on without warning. She now has started bringing small protein snacks with her to supplement the low blood glucose episodes. She states this helps. Encouraged her to continue to plan proactively for these episodes.

## 2023-03-03 NOTE — Assessment & Plan Note (Signed)
Multiple nevi on back. Patient noticed change in skin recently. Plans to follow up with dermatology for evaluation of skin concerns. No complaint of itching or rash. Will reach out to office if referral is needed.

## 2023-03-03 NOTE — Assessment & Plan Note (Signed)
Completed physical examination. Health maintenance reviewed and updated. Discussed exercise and nutrition. Checking complete blood count, complete metabolic panel, and lipid panel. Follow up for next annual physical in 12 months.

## 2023-03-03 NOTE — Assessment & Plan Note (Addendum)
Chronic (stable). Continue to complete strengthening exercise daily. Will take ibuprofen for discomfort to management the pain as needed. Requesting a copy of Dexa Scan results.

## 2023-03-03 NOTE — Assessment & Plan Note (Addendum)
Chronic (Stable). Continues to have numbness in the feet bilaterally. Patient states the neuropathy is tolerable and it does not cause her any limitations in her mobility and activities of daily living.

## 2023-03-03 NOTE — Progress Notes (Signed)
BP 108/70 (BP Location: Left Arm)   Pulse 81   Temp 98.1 F (36.7 C) (Oral)   Ht 5' 6.5" (1.689 m)   Wt 145 lb 9.6 oz (66 kg)   SpO2 97%   BMI 23.15 kg/m    Subjective:    Patient ID: [name], female    DOB: 07/24/1940, 82 y.o.   MRN: [id number]  CC: Chief Complaint  Patient presents with   Annual Exam    With lab work-patient is not fasting    HPI: [name] is a 82 y.o. female presenting on 03/03/2023 for comprehensive medical examination. Current medical complaints include:  She shares she has concerns related to skin changes on her back. She states they are not bothersome. She denies rash or itching. She wants to follow-up up with dermatologist for evaluations.   She currently lives with: Lives with spouse Menopausal Symptoms: no  Depression and Anxiety Screen done today and results listed below:     03/03/2023   10:06 AM 12/22/2022    3:03 PM 11/19/2022    1:56 PM  Depression screen PHQ 2/9  Decreased Interest 0 0 0  Down, Depressed, Hopeless 0 0 0  PHQ - 2 Score 0 0 0  Altered sleeping 1  0  Tired, decreased energy 0  0  Change in appetite 0  0  Feeling bad or failure about yourself  0  0  Trouble concentrating 0  0  Moving slowly or fidgety/restless 0  0  Suicidal thoughts 0  0  PHQ-9 Score 1  0  Difficult doing work/chores Not difficult at all  Not difficult at all      03/03/2023   10:07 AM 11/19/2022    1:57 PM  GAD 7 : Generalized Anxiety Score  Nervous, Anxious, on Edge 1 1  Control/stop worrying 0 2  Worry too much - different things 1 2  Trouble relaxing 0 0  Restless 1 1  Easily annoyed or irritable 1 0  Afraid - awful might happen 0 1  Total GAD 7 Score 4 7  Anxiety Difficulty Not difficult at all     The patient does not have a history of falls. I did complete a risk assessment for falls. A plan of care for falls was not documented.   Past Medical History:  Past Medical History:  Diagnosis Date   Fibromyalgia    Hypoglycemia     Neuropathy    Osteopenia     Surgical History:  Past Surgical History:  Procedure Laterality Date   CHOLECYSTECTOMY     DILATION AND CURETTAGE OF UTERUS     RECTAL PROLAPSE REPAIR      Medications:  Current Outpatient Medications on File Prior to Visit  Medication Sig   ibuprofen (ADVIL) 200 MG tablet Take 200 mg by mouth as needed.   melatonin 5 MG TABS Take 5 mg by mouth at bedtime.   Multiple Vitamin (MULTIVITAMIN ADULT PO) Take by mouth daily.   Polyethyl Glycol-Propyl Glycol (SYSTANE ULTRA OP) Apply to eye as needed.   Psyllium (METAMUCIL PO) Take by mouth daily.   No current facility-administered medications on file prior to visit.    Allergies:  Allergies  Allergen Reactions   Penicillins Anaphylaxis   Lidocaine Other (See Comments)    Social History:  Social History   Socioeconomic History   Marital status: Married    Spouse name: Not on file   Number of children: 3   Years  of education: Not on file   Highest education level: Not on file  Occupational History   Not on file  Tobacco Use   Smoking status: Never    Passive exposure: Never   Smokeless tobacco: Never  Vaping Use   Vaping status: Never Used  Substance and Sexual Activity   Alcohol use: Yes    Alcohol/week: 3.0 standard drinks of alcohol    Types: 3 Glasses of wine per week   Drug use: Never   Sexual activity: Not Currently  Other Topics Concern   Not on file  Social History Narrative   Not on file   Social Determinants of Health   Financial Resource Strain: Low Risk  (12/22/2022)   Overall Financial Resource Strain (CARDIA)    Difficulty of Paying Living Expenses: Not hard at all  Food Insecurity: No Food Insecurity (12/22/2022)   Hunger Vital Sign    Worried About Running Out of Food in the Last Year: Never true    Ran Out of Food in the Last Year: Never true  Transportation Needs: No Transportation Needs (12/22/2022)   PRAPARE - Administrator, Civil Service  (Medical): No    Lack of Transportation (Non-Medical): No  Physical Activity: Sufficiently Active (12/22/2022)   Exercise Vital Sign    Days of Exercise per Week: 7 days    Minutes of Exercise per Session: 40 min  Stress: No Stress Concern Present (12/22/2022)   [employer] of Occupational Health - Occupational Stress Questionnaire    Feeling of Stress : Only a little  Social Connections: Not on file  Intimate Partner Violence: Not on file   Social History   Tobacco Use  Smoking Status Never   Passive exposure: Never  Smokeless Tobacco Never   Social History   Substance and Sexual Activity  Alcohol Use Yes   Alcohol/week: 3.0 standard drinks of alcohol   Types: 3 Glasses of wine per week    Family History:  Family History  Problem Relation Age of Onset   Hip fracture Mother    Alzheimer's disease Mother     Past medical history, surgical history, medications, allergies, family history and social history reviewed with patient today and changes made to appropriate areas of the chart.   Review of Systems  Constitutional:  Negative for diaphoresis, fever and weight loss.  HENT:  Negative for congestion, ear discharge, ear pain, hearing loss and sore throat.   Eyes:  Negative for blurred vision and pain.  Respiratory:  Negative for cough, shortness of breath and wheezing.   Cardiovascular:  Negative for chest pain and palpitations.  Gastrointestinal:  Positive for heartburn. Negative for abdominal pain, nausea and vomiting.       Occasional heartburn. She states she takes Tums and it goes away.   Genitourinary:  Negative for dysuria.  Musculoskeletal:  Positive for joint pain.       Generalized joint pain secondary to Fibromyalgia and Osteopenia   Skin:  Negative for itching and rash.       States she has multiple moles on back.  Neurological:  Positive for headaches.       Occasionally. Takes ibuprofen to resolve.   Endo/Heme/Allergies:  Does not bruise/bleed easily.   Psychiatric/Behavioral: Negative.     All other ROS negative except what is listed above and in the HPI.      Objective:    BP 108/70 (BP Location: Left Arm)   Pulse 81   Temp 98.1 F (36.7  C) (Oral)   Ht 5' 6.5" (1.689 m)   Wt 145 lb 9.6 oz (66 kg)   SpO2 97%   BMI 23.15 kg/m   Wt Readings from Last 3 Encounters:  03/03/23 145 lb 9.6 oz (66 kg)  12/22/22 140 lb (63.5 kg)  11/19/22 146 lb 3.2 oz (66.3 kg)    Physical Exam Constitutional:      General: She is not in acute distress.    Appearance: Normal appearance. She is normal weight. She is not ill-appearing.  HENT:     Head: Normocephalic.     Right Ear: Tympanic membrane, ear canal and external ear normal.     Left Ear: Tympanic membrane, ear canal and external ear normal.     Nose: Nose normal.     Mouth/Throat:     Mouth: Mucous membranes are moist.     Pharynx: No oropharyngeal exudate or posterior oropharyngeal erythema.  Eyes:     Extraocular Movements: Extraocular movements intact.  Cardiovascular:     Rate and Rhythm: Normal rate and regular rhythm.     Pulses: Normal pulses.     Heart sounds: No murmur heard.    No friction rub. No gallop.  Pulmonary:     Effort: Pulmonary effort is normal. No respiratory distress.     Breath sounds: Normal breath sounds. No wheezing, rhonchi or rales.  Abdominal:     General: Bowel sounds are normal.     Palpations: Abdomen is soft. There is no mass.     Tenderness: There is no abdominal tenderness.  Musculoskeletal:        General: Normal range of motion.     Cervical back: Normal range of motion.  Skin:    General: Skin is warm and dry.     Capillary Refill: Capillary refill takes 2 to 3 seconds.     Findings: No lesion.     Comments: Observed several nevi growths on her back, some seborrheic keratosis and several cherry angioma  Neurological:     Mental Status: She is alert and oriented to person, place, and time.     Deep Tendon Reflexes: Reflexes normal.   Psychiatric:        Mood and Affect: Mood normal.        Behavior: Behavior normal.        Thought Content: Thought content normal.        Judgment: Judgment normal.     Results for orders placed or performed in visit on 12/03/22  CBC and differential  Result Value Ref Range   Hemoglobin 13.3 12.0 - 16.0   HCT 40 36 - 46   Platelets 287 150 - 400 K/uL   WBC 4.4   CBC  Result Value Ref Range   RBC 4.4 3.87 - 5.11  Basic metabolic panel  Result Value Ref Range   Glucose 100    BUN 20 4 - 21   CO2 28 (A) 13 - 22   Creatinine 0.6 0.5 - 1.1   Potassium 4.3 3.5 - 5.1 mEq/L   Sodium 141 137 - 147   Chloride 107 99 - 108  Comprehensive metabolic panel  Result Value Ref Range   eGFR 89    Calcium 9.6 8.7 - 10.7   Albumin 4.4 3.5 - 5.0  Hepatic function panel  Result Value Ref Range   ALT 25 7 - 35 U/L   AST 22 13 - 35      Assessment & Plan:  Problem List Items Addressed This Visit       Digestive   Rectal prolapse    Chronic (Ongoing). 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  She was informed by the general surgeon she has scar tissue from previous surgery. She is having some issues with rectum again and is following a treatment plan for management which is helping.         Endocrine   Hypoglycemia    Chronic (Ongoing). Patient continues to have hypoglycemic episodes. Patient confirms she eats her regular 3 meals/day. However patient states the episodes come on without warning. She now has started bringing small protein snacks with her to supplement the low blood glucose episodes. She states this helps. Encouraged her to continue to plan proactively for these episodes.        Nervous and Auditory   Neuropathy    Chronic (Stable). Continues to have numbness in the feet bilaterally. Patient states the neuropathy is tolerable and it does not cause her any limitations in her mobility and  activities of daily living.         Musculoskeletal and Integument   Osteopenia    Chronic (stable). Continue to complete strengthening exercise daily. Will take ibuprofen for discomfort to management the pain as needed. Requesting a copy of Dexa Scan results.        Other   Fibromyalgia    Chronic (stable). Continue to have moderate joint pain. Patient states she is able to tolerate the joint pain. She is able to complete all task without limitations. She take ibuprofen as need for the pain. Continue to exercise regularly.      Routine general medical examination at a health care facility - Primary    Completed physical examination. Health maintenance reviewed and updated. Discussed exercise and nutrition. Checking complete blood count, complete metabolic panel, and lipid panel. Follow up for next annual physical in 12 months.       Pure hypercholesterolemia    Chronic (stable). Elevated lipid levels from 2020. Checking lipid panel today.      Relevant Orders   CBC with Differential/Platelet   Comprehensive metabolic panel   Lipid panel   Multiple nevi    Multiple nevi on back. Patient noticed change in skin recently. Plans to follow up with dermatology for evaluation of skin concerns. No complaint of itching or rash. Will reach out to office if referral is needed.        Follow up plan: Return in about 1 year (around 03/02/2024) for CPE.   LABORATORY TESTING:  - Pap smear: not applicable  IMMUNIZATIONS:   - Tdap: Tetanus vaccination status reviewed:  Last tetanus 2013. Plan to get tetanus vaccine from pharmacy.  - Influenza: Up to date - Pneumovax: Not applicable - Prevnar: Not applicable - HPV: Not applicable - Zostavax vaccine: Has received zostavax  however discussed with patient about receiving Shingrix vaccine  SCREENING: -Mammogram: Up to date  - Colonoscopy: Not applicable  - Bone Density: Done elsewhere   PATIENT COUNSELING:   Advised to take 1 mg of  folate supplement per day if capable of pregnancy.   Sexuality: Discussed sexually transmitted diseases, partner selection, use of condoms, avoidance of unintended pregnancy  and contraceptive alternatives.   Advised to avoid cigarette smoking.  I discussed with the patient that most people either abstain from alcohol or drink within safe limits (<=14/week and <=4 drinks/occasion for males, <=  7/weeks and <= 3 drinks/occasion for females) and that the risk for alcohol disorders and other health effects rises proportionally with the number of drinks per week and how often a drinker exceeds daily limits.  Discussed cessation/primary prevention of drug use and availability of treatment for abuse.   Diet: Encouraged to adjust caloric intake to maintain  or achieve ideal body weight, to reduce intake of dietary saturated fat and total fat, to limit sodium intake by avoiding high sodium foods and not adding table salt, and to maintain adequate dietary potassium and calcium preferably from fresh fruits, vegetables, and low-fat dairy products.    stressed the importance of regular exercise  Injury prevention: Discussed safety belts, safety helmets, smoke detector, smoking near bedding or upholstery.   Dental health: Discussed importance of regular tooth brushing, flossing, and dental visits.    NEXT PREVENTATIVE PHYSICAL DUE IN 1 YEAR. Return in about 1 year (around 03/02/2024) for CPE.  [facility]

## 2023-03-03 NOTE — Assessment & Plan Note (Signed)
Chronic (stable). Continue to have moderate joint pain. Patient states she is able to tolerate the joint pain. She is able to complete all task without limitations. She take ibuprofen as need for the pain. Continue to exercise regularly.

## 2023-03-03 NOTE — Patient Instructions (Addendum)
It was great to see you!  We are checking your labs today and will let you know the results via mychart/phone.   Let me know if you need a referral to the dermatologist  You are due for your tetanus vaccine, the last one was in 2013  Keep up the great work!   Let's follow-up in 1 year, sooner if you have concerns.  If a referral was placed today, you will be contacted for an appointment. Please note that routine referrals can sometimes take up to 3-4 weeks to process. Please call our office if you haven't heard anything after this time frame.  Take care,  Rodman Pickle, NP

## 2023-05-04 DIAGNOSIS — Z0100 Encounter for examination of eyes and vision without abnormal findings: Secondary | ICD-10-CM | POA: Diagnosis not present

## 2023-07-01 ENCOUNTER — Telehealth: Payer: Self-pay | Admitting: Nurse Practitioner

## 2023-07-01 NOTE — Telephone Encounter (Signed)
Pt called and really need help getting in her my chart . I let the pt know we have a my chart help desk phone number . The patient already have that but said that's still not helping her get into her chart. Patient said call her

## 2023-07-01 NOTE — Telephone Encounter (Signed)
error 

## 2023-07-01 NOTE — Telephone Encounter (Signed)
I called and spoke with patient and she wanted to know last lab work that was done in August 2024 and I gave patient results and also patient questioned how to retrieve her Mychart password and advised patient to enter forgot password so she could reset it. I told patient if she had any other questions or concerns to call back.

## 2023-07-31 DIAGNOSIS — H52223 Regular astigmatism, bilateral: Secondary | ICD-10-CM | POA: Diagnosis not present

## 2023-07-31 DIAGNOSIS — H524 Presbyopia: Secondary | ICD-10-CM | POA: Diagnosis not present

## 2023-08-20 DIAGNOSIS — G8929 Other chronic pain: Secondary | ICD-10-CM | POA: Diagnosis not present

## 2023-08-20 DIAGNOSIS — M25561 Pain in right knee: Secondary | ICD-10-CM | POA: Diagnosis not present

## 2023-08-20 DIAGNOSIS — M1711 Unilateral primary osteoarthritis, right knee: Secondary | ICD-10-CM | POA: Diagnosis not present

## 2023-10-05 ENCOUNTER — Other Ambulatory Visit (HOSPITAL_BASED_OUTPATIENT_CLINIC_OR_DEPARTMENT_OTHER): Payer: Self-pay | Admitting: Nurse Practitioner

## 2023-10-05 DIAGNOSIS — Z1231 Encounter for screening mammogram for malignant neoplasm of breast: Secondary | ICD-10-CM

## 2023-10-22 ENCOUNTER — Ambulatory Visit (HOSPITAL_BASED_OUTPATIENT_CLINIC_OR_DEPARTMENT_OTHER)
Admission: RE | Admit: 2023-10-22 | Discharge: 2023-10-22 | Disposition: A | Discharge status: Home / Self Care | Source: Ambulatory Visit | Attending: Nurse Practitioner | Admitting: Nurse Practitioner

## 2023-10-22 ENCOUNTER — Encounter (HOSPITAL_BASED_OUTPATIENT_CLINIC_OR_DEPARTMENT_OTHER): Payer: Self-pay

## 2023-10-22 DIAGNOSIS — Z1231 Encounter for screening mammogram for malignant neoplasm of breast: Secondary | ICD-10-CM | POA: Insufficient documentation

## 2023-10-26 ENCOUNTER — Encounter: Payer: Self-pay | Admitting: Nurse Practitioner

## 2023-12-30 DIAGNOSIS — D485 Neoplasm of uncertain behavior of skin: Secondary | ICD-10-CM | POA: Diagnosis not present

## 2023-12-30 DIAGNOSIS — C44722 Squamous cell carcinoma of skin of right lower limb, including hip: Secondary | ICD-10-CM | POA: Diagnosis not present

## 2024-01-20 DIAGNOSIS — C44722 Squamous cell carcinoma of skin of right lower limb, including hip: Secondary | ICD-10-CM | POA: Diagnosis not present

## 2024-02-15 DIAGNOSIS — M1711 Unilateral primary osteoarthritis, right knee: Secondary | ICD-10-CM | POA: Diagnosis not present

## 2024-03-04 ENCOUNTER — Ambulatory Visit: Payer: Self-pay | Admitting: Nurse Practitioner

## 2024-03-04 ENCOUNTER — Encounter: Payer: Self-pay | Admitting: Nurse Practitioner

## 2024-03-04 ENCOUNTER — Ambulatory Visit (INDEPENDENT_AMBULATORY_CARE_PROVIDER_SITE_OTHER): Payer: Medicare HMO | Admitting: Nurse Practitioner

## 2024-03-04 VITALS — BP 110/76 | HR 75 | Temp 97.5°F | Ht 66.5 in | Wt 147.8 lb

## 2024-03-04 DIAGNOSIS — Z8589 Personal history of malignant neoplasm of other organs and systems: Secondary | ICD-10-CM | POA: Insufficient documentation

## 2024-03-04 DIAGNOSIS — Z0000 Encounter for general adult medical examination without abnormal findings: Secondary | ICD-10-CM

## 2024-03-04 DIAGNOSIS — K623 Rectal prolapse: Secondary | ICD-10-CM

## 2024-03-04 DIAGNOSIS — E559 Vitamin D deficiency, unspecified: Secondary | ICD-10-CM | POA: Diagnosis not present

## 2024-03-04 DIAGNOSIS — E7800 Pure hypercholesterolemia, unspecified: Secondary | ICD-10-CM

## 2024-03-04 DIAGNOSIS — M8580 Other specified disorders of bone density and structure, unspecified site: Secondary | ICD-10-CM | POA: Diagnosis not present

## 2024-03-04 DIAGNOSIS — M797 Fibromyalgia: Secondary | ICD-10-CM | POA: Diagnosis not present

## 2024-03-04 LAB — CBC WITH DIFFERENTIAL/PLATELET
Basophils Absolute: 0 K/uL (ref 0.0–0.1)
Basophils Relative: 0.5 % (ref 0.0–3.0)
Eosinophils Absolute: 0 K/uL (ref 0.0–0.7)
Eosinophils Relative: 0.3 % (ref 0.0–5.0)
HCT: 42.2 % (ref 36.0–46.0)
Hemoglobin: 13.9 g/dL (ref 12.0–15.0)
Lymphocytes Relative: 30.9 % (ref 12.0–46.0)
Lymphs Abs: 1.3 K/uL (ref 0.7–4.0)
MCHC: 32.8 g/dL (ref 30.0–36.0)
MCV: 89.7 fl (ref 78.0–100.0)
Monocytes Absolute: 0.4 K/uL (ref 0.1–1.0)
Monocytes Relative: 9.9 % (ref 3.0–12.0)
Neutro Abs: 2.5 K/uL (ref 1.4–7.7)
Neutrophils Relative %: 58.4 % (ref 43.0–77.0)
Platelets: 318 K/uL (ref 150.0–400.0)
RBC: 4.7 Mil/uL (ref 3.87–5.11)
RDW: 13.3 % (ref 11.5–15.5)
WBC: 4.2 K/uL (ref 4.0–10.5)

## 2024-03-04 LAB — COMPREHENSIVE METABOLIC PANEL WITH GFR
ALT: 22 U/L (ref 0–35)
AST: 20 U/L (ref 0–37)
Albumin: 4.4 g/dL (ref 3.5–5.2)
Alkaline Phosphatase: 70 U/L (ref 39–117)
BUN: 14 mg/dL (ref 6–23)
CO2: 27 meq/L (ref 19–32)
Calcium: 9.6 mg/dL (ref 8.4–10.5)
Chloride: 102 meq/L (ref 96–112)
Creatinine, Ser: 0.61 mg/dL (ref 0.40–1.20)
GFR: 82.72 mL/min (ref >60.00)
Glucose, Bld: 106 mg/dL — ABNORMAL HIGH (ref 70–99)
Potassium: 4.2 meq/L (ref 3.5–5.1)
Sodium: 138 meq/L (ref 135–145)
Total Bilirubin: 0.4 mg/dL (ref 0.2–1.2)
Total Protein: 6.7 g/dL (ref 6.0–8.3)

## 2024-03-04 LAB — LIPID PANEL
Cholesterol: 226 mg/dL — ABNORMAL HIGH (ref 0–200)
HDL: 79.7 mg/dL (ref >39.00)
LDL Cholesterol: 123 mg/dL — ABNORMAL HIGH (ref 0–99)
NonHDL: 145.82
Total CHOL/HDL Ratio: 3
Triglycerides: 113 mg/dL (ref 0.0–149.0)
VLDL: 22.6 mg/dL (ref 0.0–40.0)

## 2024-03-04 LAB — VITAMIN D 25 HYDROXY (VIT D DEFICIENCY, FRACTURES): VITD: 45.03 ng/mL (ref 30.00–100.00)

## 2024-03-04 NOTE — Patient Instructions (Signed)
It was great to see you!  We are checking your labs today and will let you know the results via mychart/phone.   Keep up the great work!   Let's follow-up in 1 year, sooner if you have concerns.  If a referral was placed today, you will be contacted for an appointment. Please note that routine referrals can sometimes take up to 3-4 weeks to process. Please call our office if you haven't heard anything after this time frame.  Take care,  Nivin Braniff, NP  

## 2024-03-04 NOTE — Assessment & Plan Note (Signed)
 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 Continue following with dermatology regularly.

## 2024-03-04 NOTE — Assessment & Plan Note (Signed)
 Chronic, stable.  Continue ibuprofen as needed for pain and walking regularly. Check ANA today.

## 2024-03-04 NOTE — Assessment & Plan Note (Signed)
 Health maintenance reviewed and updated. Discussed nutrition, exercise. Follow-up 1 year.

## 2024-03-04 NOTE — Progress Notes (Signed)
 BP 110/76 (BP Location: Left Arm, Patient Position: Sitting, Cuff Size: Normal)   Pulse 75   Temp (!) 97.5 F (36.4 C)   Ht 5' 6.5 (1.689 m)   Wt 147 lb 12.8 oz (67 kg)   SpO2 98%   BMI 23.50 kg/m    Subjective:    Patient ID: [name], female    DOB: 1940/09/03, 83 y.o.   MRN: [id number]  CC: Chief Complaint  Patient presents with   Annual Exam    With labs-patient is not fasting    HPI: [name] is a 83 y.o. female presenting on 03/04/2024 for comprehensive medical examination. Current medical complaints include:none  She currently lives with: husband Menopausal Symptoms: no  Depression and Anxiety Screen done today and results listed below:     03/04/2024   10:10 AM 03/03/2023   10:06 AM 12/22/2022    3:03 PM 11/19/2022    1:56 PM  Depression screen PHQ 2/9  Decreased Interest 1 0 0 0  Down, Depressed, Hopeless 1 0 0 0  PHQ - 2 Score 2 0 0 0  Altered sleeping 0 1  0  Tired, decreased energy 0 0  0  Change in appetite 0 0  0  Feeling bad or failure about yourself  0 0  0  Trouble concentrating 1 0  0  Moving slowly or fidgety/restless 0 0  0  Suicidal thoughts 0 0  0  PHQ-9 Score 3 1  0  Difficult doing work/chores Not difficult at all Not difficult at all  Not difficult at all      03/04/2024   10:10 AM 03/03/2023   10:07 AM 11/19/2022    1:57 PM  GAD 7 : Generalized Anxiety Score  Nervous, Anxious, on Edge 1 1 1   Control/stop worrying 1 0 2  Worry too much - different things 0 1 2  Trouble relaxing 1 0 0  Restless 0 1 1  Easily annoyed or irritable 0 1 0  Afraid - awful might happen 0 0 1  Total GAD 7 Score 3 4 7   Anxiety Difficulty Not difficult at all Not difficult at all     The patient has a history of falls. I did complete a risk assessment for falls. A plan of care for falls was documented.   Past Medical History:  Past Medical History:  Diagnosis Date   Fibromyalgia    History of squamous cell carcinoma    Hypoglycemia     Neuropathy    Osteopenia     Surgical History:  Past Surgical History:  Procedure Laterality Date   CHOLECYSTECTOMY     DILATION AND CURETTAGE OF UTERUS     RECTAL PROLAPSE REPAIR      Medications:  Current Outpatient Medications on File Prior to Visit  Medication Sig   ibuprofen (ADVIL) 200 MG tablet Take 200 mg by mouth as needed.   melatonin 5 MG TABS Take 5 mg by mouth at bedtime.   Multiple Vitamin (MULTIVITAMIN ADULT PO) Take by mouth daily.   Polyethyl Glycol-Propyl Glycol (SYSTANE ULTRA OP) Apply to eye as needed.   Psyllium (METAMUCIL PO) Take by mouth daily.   No current facility-administered medications on file prior to visit.    Allergies:  Allergies  Allergen Reactions   Penicillins Anaphylaxis   Lidocaine Other (See Comments)   Prednisone Other (See Comments)    Makes face red    Social History:  Social History   Socioeconomic  History   Marital status: Married    Spouse name: Not on file   Number of children: 3   Years of education: Not on file   Highest education level: Not on file  Occupational History   Not on file  Tobacco Use   Smoking status: Never    Passive exposure: Never   Smokeless tobacco: Never  Vaping Use   Vaping status: Never Used  Substance and Sexual Activity   Alcohol use: Yes    Alcohol/week: 3.0 standard drinks of alcohol    Types: 3 Glasses of wine per week   Drug use: Never   Sexual activity: Not Currently  Other Topics Concern   Not on file  Social History Narrative   Not on file   Social Drivers of Health   Financial Resource Strain: Low Risk  (12/22/2022)   Overall Financial Resource Strain (CARDIA)    Difficulty of Paying Living Expenses: Not hard at all  Food Insecurity: No Food Insecurity (12/22/2022)   Hunger Vital Sign    Worried About Running Out of Food in the Last Year: Never true    Ran Out of Food in the Last Year: Never true  Transportation Needs: No Transportation Needs (12/22/2022)   PRAPARE -  Administrator, Civil Service (Medical): No    Lack of Transportation (Non-Medical): No  Physical Activity: Sufficiently Active (12/22/2022)   Exercise Vital Sign    Days of Exercise per Week: 7 days    Minutes of Exercise per Session: 40 min  Stress: No Stress Concern Present (12/22/2022)   [employer] of Occupational Health - Occupational Stress Questionnaire    Feeling of Stress : Only a little  Social Connections: Not on file  Intimate Partner Violence: Not on file   Social History   Tobacco Use  Smoking Status Never   Passive exposure: Never  Smokeless Tobacco Never   Social History   Substance and Sexual Activity  Alcohol Use Yes   Alcohol/week: 3.0 standard drinks of alcohol   Types: 3 Glasses of wine per week    Family History:  Family History  Problem Relation Age of Onset   Hip fracture Mother    Alzheimer's disease Mother     Past medical history, surgical history, medications, allergies, family history and social history reviewed with patient today and changes made to appropriate areas of the chart.   Review of Systems  Constitutional: Negative.   HENT: Negative.    Eyes: Negative.   Respiratory: Negative.    Cardiovascular: Negative.   Gastrointestinal: Negative.   Genitourinary: Negative.   Musculoskeletal: Negative.   Skin: Negative.   Neurological: Negative.   Psychiatric/Behavioral: Negative.     All other ROS negative except what is listed above and in the HPI.      Objective:    BP 110/76 (BP Location: Left Arm, Patient Position: Sitting, Cuff Size: Normal)   Pulse 75   Temp (!) 97.5 F (36.4 C)   Ht 5' 6.5 (1.689 m)   Wt 147 lb 12.8 oz (67 kg)   SpO2 98%   BMI 23.50 kg/m   Wt Readings from Last 3 Encounters:  03/04/24 147 lb 12.8 oz (67 kg)  03/03/23 145 lb 9.6 oz (66 kg)  12/22/22 140 lb (63.5 kg)    Physical Exam Vitals and nursing note reviewed.  Constitutional:      General: She is not in acute distress.     Appearance: Normal appearance.  HENT:  Head: Normocephalic and atraumatic.     Right Ear: Tympanic membrane, ear canal and external ear normal.     Left Ear: Tympanic membrane, ear canal and external ear normal.     Mouth/Throat:     Mouth: Mucous membranes are moist.     Pharynx: No posterior oropharyngeal erythema.  Eyes:     Conjunctiva/sclera: Conjunctivae normal.  Cardiovascular:     Rate and Rhythm: Normal rate and regular rhythm.     Pulses: Normal pulses.     Heart sounds: Normal heart sounds.  Pulmonary:     Effort: Pulmonary effort is normal.     Breath sounds: Normal breath sounds.  Abdominal:     Palpations: Abdomen is soft.     Tenderness: There is no abdominal tenderness.  Musculoskeletal:        General: Normal range of motion.     Cervical back: Normal range of motion and neck supple.     Right lower leg: No edema.     Left lower leg: No edema.  Lymphadenopathy:     Cervical: No cervical adenopathy.  Skin:    General: Skin is warm and dry.  Neurological:     General: No focal deficit present.     Mental Status: She is alert and oriented to person, place, and time.     Cranial Nerves: No cranial nerve deficit.     Coordination: Coordination normal.     Gait: Gait normal.  Psychiatric:        Mood and Affect: Mood normal.        Behavior: Behavior normal.        Thought Content: Thought content normal.        Judgment: Judgment normal.     Results for orders placed or performed in visit on 03/09/23  HM DEXA SCAN   Collection Time: 12/10/17  7:28 AM  Result Value Ref Range   HM Dexa Scan Osteopenia   HM MAMMOGRAPHY   Collection Time: 09/14/20  7:35 AM  Result Value Ref Range   HM Mammogram 0-4 Bi-Rad 0-4 Bi-Rad, Self Reported Normal      Assessment & Plan:   Problem List Items Addressed This Visit       Digestive   Rectal prolapse   Chronic, stable. She makes sure that her bowels stay regular and does not strain.         Musculoskeletal  and Integument   Osteopenia   Chronic, stable. She has tried multiple medications in the past, however they all caused joint pain. She is currently taking a multivitamin and walking regularly.         Other   Fibromyalgia   Chronic, stable.  Continue ibuprofen as needed for pain and walking regularly. Check ANA today.       Relevant Orders   ANA w/Reflex   Routine general medical examination at a health care facility - Primary   Health maintenance reviewed and updated. Discussed nutrition, exercise. Follow-up 1 year.        Pure hypercholesterolemia   Chronic, stable. Check CMP, CBC, lipid panel today.       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 Continue following with dermatology regularly.       Other Visit Diagnoses       Vitamin D   insufficiency       Relevant Orders   VITAMIN D  25 Hydroxy (Vit-D Deficiency, Fractures)        Follow up plan: Return in about 1 year (around 03/04/2025) for CPE.   LABORATORY TESTING:  - Pap smear: not applicable  IMMUNIZATIONS:   - Tdap: Tetanus vaccination status reviewed: will get at pharmacy. - Influenza: Postponed to flu season - Pneumovax: Up to date - Prevnar: Up to date - HPV: Not applicable - Shingrix vaccine: Declined  SCREENING: -Mammogram: Up to date  - Colonoscopy: Not applicable  - Bone Density: Up to date   PATIENT COUNSELING:   Advised to take 1 mg of folate supplement per day if capable of pregnancy.   Sexuality: Discussed sexually transmitted diseases, partner selection, use of condoms, avoidance of unintended pregnancy  and contraceptive alternatives.   Advised to avoid cigarette smoking.  I discussed with the patient that most people either abstain from alcohol or drink within safe limits (<=14/week and <=4 drinks/occasion for  males, <=7/weeks and <= 3 drinks/occasion for females) and that the risk for alcohol disorders and other health effects rises proportionally with the number of drinks per week and how often a drinker exceeds daily limits.  Discussed cessation/primary prevention of drug use and availability of treatment for abuse.   Diet: Encouraged to adjust caloric intake to maintain  or achieve ideal body weight, to reduce intake of dietary saturated fat and total fat, to limit sodium intake by avoiding high sodium foods and not adding table salt, and to maintain adequate dietary potassium and calcium preferably from fresh fruits, vegetables, and low-fat dairy products.    stressed the importance of regular exercise  Injury prevention: Discussed safety belts, safety helmets, smoke detector, smoking near bedding or upholstery.   Dental health: Discussed importance of regular tooth brushing, flossing, and dental visits.    NEXT PREVENTATIVE PHYSICAL DUE IN 1 YEAR. Return in about 1 year (around 03/04/2025) for CPE.  Brittie Whisnant A Kamil Mchaffie

## 2024-03-04 NOTE — Assessment & Plan Note (Signed)
Chronic, stable.  Check CMP, CBC, lipid panel today. 

## 2024-03-04 NOTE — Assessment & Plan Note (Signed)
 Chronic, stable. She has tried multiple medications in the past, however they all caused joint pain. She is currently taking a multivitamin and walking regularly.

## 2024-03-04 NOTE — Assessment & Plan Note (Signed)
 Chronic, stable. She makes sure that her bowels stay regular and does not strain.

## 2024-03-06 LAB — ANA W/REFLEX: Anti Nuclear Antibody (ANA): NEGATIVE

## 2024-04-25 ENCOUNTER — Ambulatory Visit

## 2024-04-25 DIAGNOSIS — Z0000 Encounter for general adult medical examination without abnormal findings: Secondary | ICD-10-CM

## 2024-04-25 NOTE — Patient Instructions (Signed)
 Ms. Mcgahee,  Thank you for taking the time for your Medicare Wellness Visit. I appreciate your continued commitment to your health goals. Please review the care plan we discussed, and feel free to reach out if I can assist you further.  Medicare recommends these wellness visits once per year to help you and your care team stay ahead of potential health issues. These visits are designed to focus on prevention, allowing your provider to concentrate on managing your acute and chronic conditions during your regular appointments.  Please note that Annual Wellness Visits do not include a physical exam. Some assessments may be limited, especially if the visit was conducted virtually. If needed, we may recommend a separate in-person follow-up with your provider.  Ongoing Care Seeing your primary care provider every 3 to 6 months helps us  monitor your health and provide consistent, personalized care.   Referrals If a referral was made during today's visit and you haven't received any updates within two weeks, please contact the referred provider directly to check on the status.  Recommended Screenings:  Health Maintenance  Topic Date Due   Zoster (Shingles) Vaccine (1 of 2) 10/29/1959   DTaP/Tdap/Td vaccine (2 - Td or Tdap) 10/07/2021   COVID-19 Vaccine (4 - 2025-26 season) 03/21/2024   Flu Shot  10/18/2024*   Medicare Annual Wellness Visit  04/25/2025   Pneumococcal Vaccine for age over 31  Completed   DEXA scan (bone density measurement)  Completed   Meningitis B Vaccine  Aged Out  *Topic was postponed. The date shown is not the original due date.       04/25/2024   11:32 AM  Advanced Directives  Does Patient Have a Medical Advance Directive? Yes  Type of Estate agent of [place];Living will  Copy of Healthcare Power of Attorney in Chart? No - copy requested   Advance Care Planning is important because it: Ensures you receive medical care that aligns with your  values, goals, and preferences. Provides guidance to your family and loved ones, reducing the emotional burden of decision-making during critical moments.  Vision: Annual vision screenings are recommended for early detection of glaucoma, cataracts, and diabetic retinopathy. These exams can also reveal signs of chronic conditions such as diabetes and high blood pressure.  Dental: Annual dental screenings help detect early signs of oral cancer, gum disease, and other conditions linked to overall health, including heart disease and diabetes.  Please see the attached documents for additional preventive care recommendations.

## 2024-04-25 NOTE — Progress Notes (Signed)
 Subjective:   ARRA CONNAUGHTON is a 83 y.o. who presents for a Medicare Wellness preventive visit.  As a reminder, Annual Wellness Visits don't include a physical exam, and some assessments may be limited, especially if this visit is performed virtually. We may recommend an in-person follow-up visit with your provider if needed.  Visit Complete: Virtual I connected with  RAMATA STROTHMAN on 04/25/24 by a audio enabled telemedicine application and verified that I am speaking with the correct person using two identifiers.  Patient Location: Home  Provider Location: Office/Clinic  I discussed the limitations of evaluation and management by telemedicine. The patient expressed understanding and agreed to proceed.  Vital Signs: Because this visit was a virtual/telehealth visit, some criteria may be missing or patient reported. Any vitals not documented were not able to be obtained and vitals that have been documented are patient reported.  VideoDeclined- This patient declined Librarian, academic. Therefore the visit was completed with audio only.  Persons Participating in Visit: Patient.  AWV Questionnaire: No: Patient Medicare AWV questionnaire was not completed prior to this visit.  Cardiac Risk Factors include: advanced age (>34men, >61 women)     Objective:    Today's Vitals   There is no height or weight on file to calculate BMI.     04/25/2024   11:32 AM 12/22/2022    3:02 PM  Advanced Directives  Does Patient Have a Medical Advance Directive? Yes Yes  Type of Estate agent of [place];Living will Healthcare Power of [place];Living will  Copy of Healthcare Power of Attorney in Chart? No - copy requested No - copy requested    Current Medications (verified) Outpatient Encounter Medications as of 04/25/2024  Medication Sig   ibuprofen (ADVIL) 200 MG tablet Take 200 mg by mouth as needed.   melatonin 5 MG TABS Take 5 mg by mouth at  bedtime.   Multiple Vitamin (MULTIVITAMIN ADULT PO) Take by mouth daily.   Polyethyl Glycol-Propyl Glycol (SYSTANE ULTRA OP) Apply to eye as needed.   Psyllium (METAMUCIL PO) Take by mouth daily.   No facility-administered encounter medications on file as of 04/25/2024.    Allergies (verified) Penicillins, Lidocaine, and Prednisone   History: Past Medical History:  Diagnosis Date   Fibromyalgia    History of squamous cell carcinoma    Hypoglycemia    Neuropathy    Osteopenia    Past Surgical History:  Procedure Laterality Date   CHOLECYSTECTOMY     DILATION AND CURETTAGE OF UTERUS     RECTAL PROLAPSE REPAIR     Family History  Problem Relation Age of Onset   Hip fracture Mother    Alzheimer's disease Mother    Social History   Socioeconomic History   Marital status: Married    Spouse name: Not on file   Number of children: 3   Years of education: Not on file   Highest education level: Not on file  Occupational History   Not on file  Tobacco Use   Smoking status: Never    Passive exposure: Never   Smokeless tobacco: Never  Vaping Use   Vaping status: Never Used  Substance and Sexual Activity   Alcohol use: Yes    Alcohol/week: 3.0 standard drinks of alcohol    Types: 3 Glasses of wine per week   Drug use: Never   Sexual activity: Not Currently  Other Topics Concern   Not on file  Social History Narrative   Not  on file   Social Drivers of Health   Financial Resource Strain: Low Risk  (04/25/2024)   Overall Financial Resource Strain (CARDIA)    Difficulty of Paying Living Expenses: Not hard at all  Food Insecurity: No Food Insecurity (04/25/2024)   Hunger Vital Sign    Worried About Running Out of Food in the Last Year: Never true    Ran Out of Food in the Last Year: Never true  Transportation Needs: No Transportation Needs (04/25/2024)   PRAPARE - Administrator, Civil Service (Medical): No    Lack of Transportation (Non-Medical): No   Physical Activity: Sufficiently Active (04/25/2024)   Exercise Vital Sign    Days of Exercise per Week: 6 days    Minutes of Exercise per Session: 50 min  Stress: No Stress Concern Present (04/25/2024)   [employer] of Occupational Health - Occupational Stress Questionnaire    Feeling of Stress: Only a little  Social Connections: Moderately Integrated (04/25/2024)   Social Connection and Isolation Panel    Frequency of Communication with Friends and Family: More than three times a week    Frequency of Social Gatherings with Friends and Family: More than three times a week    Attends Religious Services: Never    Database administrator or Organizations: Yes    Attends Engineer, structural: More than 4 times per year    Marital Status: Married    Tobacco Counseling Counseling given: Not Answered    Clinical Intake:  Pre-visit preparation completed: Yes  Pain : No/denies pain     Nutritional Risks: None Diabetes: No  No results found for: HGBA1C   How often do you need to have someone help you when you read instructions, pamphlets, or other written materials from your doctor or pharmacy?: 1 - Never  Interpreter Needed?: No  Information entered by :: NAllen LPN   Activities of Daily Living     04/25/2024   11:27 AM  In your present state of health, do you have any difficulty performing the following activities:  Hearing? 0  Vision? 0  Difficulty concentrating or making decisions? 0  Walking or climbing stairs? 0  Dressing or bathing? 0  Doing errands, shopping? 0  Preparing Food and eating ? N  Using the Toilet? N  In the past six months, have you accidently leaked urine? N  Do you have problems with loss of bowel control? N  Managing your Medications? N  Managing your Finances? N  Housekeeping or managing your Housekeeping? N    Patient Care Team: Nedra Tinnie LABOR, NP as PCP - General (Internal Medicine)  I have updated your Care Teams any  recent Medical Services you may have received from other providers in the past year.     Assessment:   This is a routine wellness examination for Kristinia.  Hearing/Vision screen Hearing Screening - Comments:: Denies hearing issues Vision Screening - Comments:: Regular eye exams, [facility]   Goals Addressed             This Visit's Progress    Patient Stated       04/25/2024, stay alive       Depression Screen     04/25/2024   11:34 AM 03/04/2024   10:10 AM 03/03/2023   10:06 AM 12/22/2022    3:03 PM 11/19/2022    1:56 PM  PHQ 2/9 Scores  PHQ - 2 Score 0 2 0 0 0  PHQ- 9  Score 2 3 1   0    Fall Risk     04/25/2024   11:33 AM 03/04/2024   10:10 AM 03/03/2023   10:04 AM 12/22/2022    3:03 PM 11/19/2022    1:55 PM  Fall Risk   Falls in the past year? 1 1 0 0 0  Comment missed a step      Number falls in past yr: 0 0 0 0 0  Injury with Fall? 1 1 0 0 0  Comment bruised up      Risk for fall due to : Medication side effect History of fall(s) No Fall Risks No Fall Risks No Fall Risks  Follow up Falls prevention discussed;Falls evaluation completed Falls evaluation completed Falls evaluation completed Falls prevention discussed;Education provided;Falls evaluation completed Falls evaluation completed    MEDICARE RISK AT HOME:  Medicare Risk at Home Any stairs in or around the home?: Yes If so, are there any without handrails?: No Home free of loose throw rugs in walkways, pet beds, electrical cords, etc?: Yes Adequate lighting in your home to reduce risk of falls?: Yes Life alert?: No Use of a cane, walker or w/c?: No Grab bars in the bathroom?: Yes Shower chair or bench in shower?: Yes Elevated toilet seat or a handicapped toilet?: Yes  TIMED UP AND GO:  Was the test performed?  No  Cognitive Function: 6CIT completed        04/25/2024   11:35 AM 12/22/2022    3:04 PM  6CIT Screen  What Year? 0 points 0 points  What month? 0 points 0 points  What time? 0 points 0  points  Count back from 20 0 points 0 points  Months in reverse 0 points 0 points  Repeat phrase 0 points 0 points  Total Score 0 points 0 points    Immunizations Immunization History  Administered Date(s) Administered   Influenza-Unspecified 05/03/2021   Moderna SARS-COV2 Booster Vaccination 05/27/2021   Moderna Sars-Covid-2 Vaccination 08/20/2019, 09/19/2019, 06/05/2020   Pneumococcal Conjugate-13 10/14/2013   Pneumococcal Polysaccharide-23 05/13/2006, 10/27/2017   Tdap 10/08/2011   Zoster, Live 09/26/2010    Screening Tests Health Maintenance  Topic Date Due   Zoster Vaccines- Shingrix (1 of 2) 10/29/1959   DTaP/Tdap/Td (2 - Td or Tdap) 10/07/2021   COVID-19 Vaccine (4 - 2025-26 season) 03/21/2024   Influenza Vaccine  10/18/2024 (Originally 02/19/2024)   Medicare Annual Wellness (AWV)  04/25/2025   Pneumococcal Vaccine: 50+ Years  Completed   DEXA SCAN  Completed   Meningococcal B Vaccine  Aged Out    Health Maintenance Items Addressed: Vaccines Due: shingles and TDAP  Additional Screening:  Vision Screening: Recommended annual ophthalmology exams for early detection of glaucoma and other disorders of the eye. Is the patient up to date with their annual eye exam?  Yes  Who is the provider or what is the name of the office in which the patient attends annual eye exams? [facility]  Dental Screening: Recommended annual dental exams for proper oral hygiene  Community Resource Referral / Chronic Care Management: CRR required this visit?  No   CCM required this visit?  No   Plan:    I have personally reviewed and noted the following in the patient's chart:   Medical and social history Use of alcohol, tobacco or illicit drugs  Current medications and supplements including opioid prescriptions. Patient is not currently taking opioid prescriptions. Functional ability and status Nutritional status Physical activity Advanced directives List  of other  physicians Hospitalizations, surgeries, and ER visits in previous 12 months Vitals Screenings to include cognitive, depression, and falls Referrals and appointments  In addition, I have reviewed and discussed with patient certain preventive protocols, quality metrics, and best practice recommendations. A written personalized care plan for preventive services as well as general preventive health recommendations were provided to patient.   Ardella FORBES Dawn, LPN   [id number]/09/[id number]   After Visit Summary: (Pick Up) Due to this being a telephonic visit, with patients personalized plan was offered to patient and patient has requested to Pick up at office.  Notes: Nothing significant to report at this time.

## 2024-05-25 DIAGNOSIS — Z129 Encounter for screening for malignant neoplasm, site unspecified: Secondary | ICD-10-CM | POA: Diagnosis not present

## 2024-05-25 DIAGNOSIS — L814 Other melanin hyperpigmentation: Secondary | ICD-10-CM | POA: Diagnosis not present

## 2024-05-25 DIAGNOSIS — D225 Melanocytic nevi of trunk: Secondary | ICD-10-CM | POA: Diagnosis not present

## 2024-05-25 DIAGNOSIS — L570 Actinic keratosis: Secondary | ICD-10-CM | POA: Diagnosis not present

## 2024-05-25 DIAGNOSIS — Z85828 Personal history of other malignant neoplasm of skin: Secondary | ICD-10-CM | POA: Diagnosis not present

## 2024-05-25 DIAGNOSIS — L821 Other seborrheic keratosis: Secondary | ICD-10-CM | POA: Diagnosis not present

## 2024-05-27 ENCOUNTER — Encounter: Payer: Self-pay | Admitting: Nurse Practitioner

## 2025-03-06 ENCOUNTER — Encounter: Admitting: Nurse Practitioner

## 2025-05-01 ENCOUNTER — Ambulatory Visit
